# Patient Record
Sex: Female | Born: 1965 | Race: White | Hispanic: No | Marital: Married | Smoking: Never smoker
Health system: Southern US, Community
[De-identification: ages and names within clinical notes are randomized; demographics above are authoritative.]

## PROBLEM LIST (undated history)

## (undated) DIAGNOSIS — I10 Essential (primary) hypertension: Secondary | ICD-10-CM

## (undated) DIAGNOSIS — F419 Anxiety disorder, unspecified: Secondary | ICD-10-CM

## (undated) DIAGNOSIS — R011 Cardiac murmur, unspecified: Secondary | ICD-10-CM

## (undated) DIAGNOSIS — K219 Gastro-esophageal reflux disease without esophagitis: Secondary | ICD-10-CM

## (undated) DIAGNOSIS — I499 Cardiac arrhythmia, unspecified: Secondary | ICD-10-CM

## (undated) DIAGNOSIS — M1990 Unspecified osteoarthritis, unspecified site: Secondary | ICD-10-CM

## (undated) DIAGNOSIS — T7840XA Allergy, unspecified, initial encounter: Secondary | ICD-10-CM

## (undated) DIAGNOSIS — D649 Anemia, unspecified: Secondary | ICD-10-CM

## (undated) DIAGNOSIS — M810 Age-related osteoporosis without current pathological fracture: Secondary | ICD-10-CM

## (undated) DIAGNOSIS — G5600 Carpal tunnel syndrome, unspecified upper limb: Secondary | ICD-10-CM

## (undated) HISTORY — PX: ABDOMINAL HYSTERECTOMY: SHX81

## (undated) HISTORY — PX: TUBAL LIGATION: SHX77

## (undated) HISTORY — DX: Carpal tunnel syndrome, unspecified upper limb: G56.00

## (undated) HISTORY — DX: Cardiac murmur, unspecified: R01.1

## (undated) HISTORY — DX: Essential (primary) hypertension: I10

## (undated) HISTORY — DX: Age-related osteoporosis without current pathological fracture: M81.0

## (undated) HISTORY — DX: Unspecified osteoarthritis, unspecified site: M19.90

## (undated) HISTORY — PX: CHOLECYSTECTOMY: SHX55

## (undated) HISTORY — DX: Anemia, unspecified: D64.9

## (undated) HISTORY — DX: Gastro-esophageal reflux disease without esophagitis: K21.9

## (undated) HISTORY — DX: Allergy, unspecified, initial encounter: T78.40XA

## (undated) HISTORY — DX: Anxiety disorder, unspecified: F41.9

## (undated) HISTORY — DX: Cardiac arrhythmia, unspecified: I49.9

---

## 2018-06-09 DIAGNOSIS — G5600 Carpal tunnel syndrome, unspecified upper limb: Secondary | ICD-10-CM | POA: Insufficient documentation

## 2018-06-16 ENCOUNTER — Ambulatory Visit (INDEPENDENT_AMBULATORY_CARE_PROVIDER_SITE_OTHER): Payer: BLUE CROSS/BLUE SHIELD | Admitting: Vascular Surgery

## 2018-06-16 ENCOUNTER — Encounter (INDEPENDENT_AMBULATORY_CARE_PROVIDER_SITE_OTHER): Payer: Self-pay | Admitting: Vascular Surgery

## 2018-06-16 VITALS — BP 138/85 | HR 80 | Resp 16 | Ht 67.0 in | Wt 217.0 lb

## 2018-06-16 DIAGNOSIS — M7989 Other specified soft tissue disorders: Secondary | ICD-10-CM

## 2018-06-16 DIAGNOSIS — I83813 Varicose veins of bilateral lower extremities with pain: Secondary | ICD-10-CM

## 2018-06-16 DIAGNOSIS — I872 Venous insufficiency (chronic) (peripheral): Secondary | ICD-10-CM

## 2018-06-16 NOTE — Progress Notes (Signed)
MRN : [id number]  Mary Olsen is a 52 y.o. (May 04, 1966) female who presents with chief complaint of painful varicose veins.  History of Present Illness:  The patient is seen for evaluation of symptomatic varicose veins. The patient relates burning and stinging which worsened steadily throughout the course of the day, particularly with standing. The patient also notes an aching and throbbing pain over the varicosities, particularly with prolonged dependent positions. The symptoms are significantly improved with elevation.  The patient also notes that during hot weather the symptoms are greatly intensified. The patient states the pain from the varicose veins interferes with work, daily exercise, shopping and household maintenance. At this point, the symptoms are persistent and severe enough that they're having a negative impact on lifestyle and are interfering with daily activities.  There is no history of DVT, PE or superficial thrombophlebitis. There is no history of ulceration or hemorrhage. The patient denies a significant family history of varicose veins. OB history: P2  The patient has not worn graduated compression in the past. At the present time the patient has not been using over-the-counter analgesics. There is no history of prior surgical intervention or sclerotherapy.    No outpatient medications have been marked as taking for the 06/16/18 encounter (Appointment) with Gilda Crease, Latina Craver, MD.    No past medical history on file.    Social History Social History   Tobacco Use  . Smoking status: Not on file  Substance Use Topics  . Alcohol use: Not on file  . Drug use: Not on file    Family History No family history on file. No family history of bleeding/clotting disorders, porphyria or autoimmune disease   Allergies not on file   REVIEW OF SYSTEMS (Negative unless checked)  Constitutional: [] Weight loss  [] Fever  [] Chills Cardiac: [] Chest pain   [] Chest pressure    [] Palpitations   [] Shortness of breath when laying flat   [] Shortness of breath with exertion. Vascular:  [] Pain in legs with walking   [x] Pain in legs at rest  [] History of DVT   [] Phlebitis   [x] Swelling in legs   [x] Varicose veins   [] Non-healing ulcers Pulmonary:   [] Uses home oxygen   [] Productive cough   [] Hemoptysis   [] Wheeze  [] COPD   [] Asthma Neurologic:  [] Dizziness   [] Seizures   [] History of stroke   [] History of TIA  [] Aphasia   [] Vissual changes   [] Weakness or numbness in arm   [] Weakness or numbness in leg Musculoskeletal:   [] Joint swelling   [] Joint pain   [] Low back pain Hematologic:  [] Easy bruising  [] Easy bleeding   [] Hypercoagulable state   [] Anemic Gastrointestinal:  [] Diarrhea   [] Vomiting  [] Gastroesophageal reflux/heartburn   [] Difficulty swallowing. Genitourinary:  [] Chronic kidney disease   [] Difficult urination  [] Frequent urination   [] Blood in urine Skin:  [] Rashes   [] Ulcers  Psychological:  [] History of anxiety   []  History of major depression.  Physical Examination  There were no vitals filed for this visit. There is no height or weight on file to calculate BMI. Gen: WD/WN, NAD Head: Quinn/AT, No temporalis wasting.  Ear/Nose/Throat: Hearing grossly intact, nares w/o erythema or drainage, poor dentition Eyes: PER, EOMI, sclera nonicteric.  Neck: Supple, no masses.  No bruit or JVD.  Pulmonary:  Good air movement, clear to auscultation bilaterally, no use of accessory muscles.  Cardiac: RRR, normal S1, S2, no Murmurs. Vascular: Large varicosities present extensively greater than 6 mm bilaterally.  Mild venous stasis changes  to the legs bilaterally.  2+ soft pitting edema Vessel Right Left  Radial Palpable Palpable  PT Palpable Palpable  DP Palpable Palpable   Gastrointestinal: soft, non-distended. No guarding/no peritoneal signs.  Musculoskeletal: M/S 5/5 throughout.  No deformity or atrophy.  Neurologic: CN 2-12 intact. Pain and light touch intact in  extremities.  Symmetrical.  Speech is fluent. Motor exam as listed above. Psychiatric: Judgment intact, Mood & affect appropriate for pt's clinical situation. Dermatologic: No rashes or ulcers noted.  No changes consistent with cellulitis. Lymph : No Cervical lymphadenopathy, no lichenification or skin changes of chronic lymphedema.  CBC No results found for: WBC, HGB, HCT, MCV, PLT  BMET No results found for: NA, K, CL, CO2, GLUCOSE, BUN, CREATININE, CALCIUM, GFRNONAA, GFRAA CrCl cannot be calculated (No successful lab value found.).  COAG No results found for: INR, PROTIME  Radiology No results found.    Assessment/Plan 1. Varicose veins of both lower extremities with pain  Recommend:  The patient has large symptomatic varicose veins that are painful and associated with swelling.  I have had a long discussion with the patient regarding  varicose veins and why they cause symptoms.  Patient will begin wearing graduated compression stockings class 1 on a daily basis, beginning first thing in the morning and removing them in the evening. The patient is instructed specifically not to sleep in the stockings.    The patient  will also begin using over-the-counter analgesics such as Motrin 600 mg po TID to help control the symptoms.    In addition, behavioral modification including elevation during the day will be initiated.    Pending the results of these changes the  patient will be reevaluated in three months.   An  ultrasound of the venous system will be obtained.   Further plans will be based on the ultrasound results and whether conservative therapies are successful at eliminating the pain and swelling.   2. Chronic venous insufficiency No surgery or intervention at this point in time.    I have had a long discussion with the patient regarding venous insufficiency and why it  causes symptoms. I have discussed with the patient the chronic skin changes that accompany venous  insufficiency and the long term sequela such as infection and ulceration.  Patient will begin wearing graduated compression stockings class 1 (20-30 mmHg) or compression wraps on a daily basis a prescription was given. The patient will put the stockings on first thing in the morning and removing them in the evening. The patient is instructed specifically not to sleep in the stockings.    In addition, behavioral modification including several periods of elevation of the lower extremities during the day will be continued. I have demonstrated that proper elevation is a position with the ankles at heart level.  The patient is instructed to begin routine exercise, especially walking on a daily basis  Patient should undergo duplex ultrasound of the venous system to ensure that DVT or reflux is not present.  Following the review of the ultrasound the patient will follow up in 2-3 months to reassess the degree of swelling and the control that graduated compression stockings or compression wraps  is offering.   The patient can be assessed for a Lymph Pump at that time  3. Leg swelling See #1&2    Levora Dredge, MD  06/16/2018 12:50 PM

## 2018-06-19 ENCOUNTER — Encounter (INDEPENDENT_AMBULATORY_CARE_PROVIDER_SITE_OTHER): Payer: Self-pay | Admitting: Vascular Surgery

## 2018-07-25 ENCOUNTER — Ambulatory Visit (INDEPENDENT_AMBULATORY_CARE_PROVIDER_SITE_OTHER): Payer: BLUE CROSS/BLUE SHIELD | Admitting: Vascular Surgery

## 2018-07-25 ENCOUNTER — Encounter (INDEPENDENT_AMBULATORY_CARE_PROVIDER_SITE_OTHER): Payer: BLUE CROSS/BLUE SHIELD

## 2019-05-19 DIAGNOSIS — I10 Essential (primary) hypertension: Secondary | ICD-10-CM | POA: Insufficient documentation

## 2019-05-19 DIAGNOSIS — K219 Gastro-esophageal reflux disease without esophagitis: Secondary | ICD-10-CM | POA: Insufficient documentation

## 2019-05-19 DIAGNOSIS — E66811 Obesity, class 1: Secondary | ICD-10-CM | POA: Insufficient documentation

## 2019-05-19 DIAGNOSIS — F419 Anxiety disorder, unspecified: Secondary | ICD-10-CM | POA: Insufficient documentation

## 2019-05-19 DIAGNOSIS — E66812 Obesity, class 2: Secondary | ICD-10-CM | POA: Insufficient documentation

## 2019-05-19 DIAGNOSIS — N959 Unspecified menopausal and perimenopausal disorder: Secondary | ICD-10-CM | POA: Insufficient documentation

## 2019-05-20 LAB — HM HEPATITIS C SCREENING LAB: HM Hepatitis Screen: NEGATIVE

## 2021-03-14 ENCOUNTER — Other Ambulatory Visit: Payer: Self-pay | Admitting: Infectious Diseases

## 2021-03-14 DIAGNOSIS — Z1231 Encounter for screening mammogram for malignant neoplasm of breast: Secondary | ICD-10-CM

## 2021-04-16 ENCOUNTER — Other Ambulatory Visit: Payer: Self-pay

## 2021-04-16 ENCOUNTER — Ambulatory Visit
Admission: RE | Admit: 2021-04-16 | Discharge: 2021-04-16 | Disposition: A | Discharge status: Home / Self Care | Payer: No Typology Code available for payment source | Source: Ambulatory Visit | Attending: Infectious Diseases | Admitting: Infectious Diseases

## 2021-04-16 DIAGNOSIS — Z1231 Encounter for screening mammogram for malignant neoplasm of breast: Secondary | ICD-10-CM | POA: Diagnosis not present

## 2021-04-17 ENCOUNTER — Inpatient Hospital Stay
Admission: RE | Admit: 2021-04-17 | Discharge: 2021-04-17 | Disposition: A | Discharge status: Home / Self Care | Payer: Self-pay | Source: Ambulatory Visit | Attending: *Deleted | Admitting: *Deleted

## 2021-04-17 ENCOUNTER — Other Ambulatory Visit: Payer: Self-pay | Admitting: *Deleted

## 2021-04-17 DIAGNOSIS — Z1231 Encounter for screening mammogram for malignant neoplasm of breast: Secondary | ICD-10-CM

## 2022-07-14 ENCOUNTER — Other Ambulatory Visit: Payer: Self-pay | Admitting: Infectious Diseases

## 2022-07-14 DIAGNOSIS — Z1231 Encounter for screening mammogram for malignant neoplasm of breast: Secondary | ICD-10-CM

## 2022-09-14 IMAGING — MG MM DIGITAL SCREENING BILAT W/ TOMO AND CAD
8 series · 8 of 24 positions shown · non-contrast
Comparison: Previous exam(s).

CLINICAL DATA: Screening.

EXAM:
DIGITAL SCREENING BILATERAL MAMMOGRAM WITH TOMOSYNTHESIS AND CAD
TECHNIQUE: Bilateral screening digital craniocaudal and mediolateral oblique
mammograms were obtained. Bilateral screening digital breast
tomosynthesis was performed. The images were evaluated with
computer-aided detection.

[L MLO synth-2D]
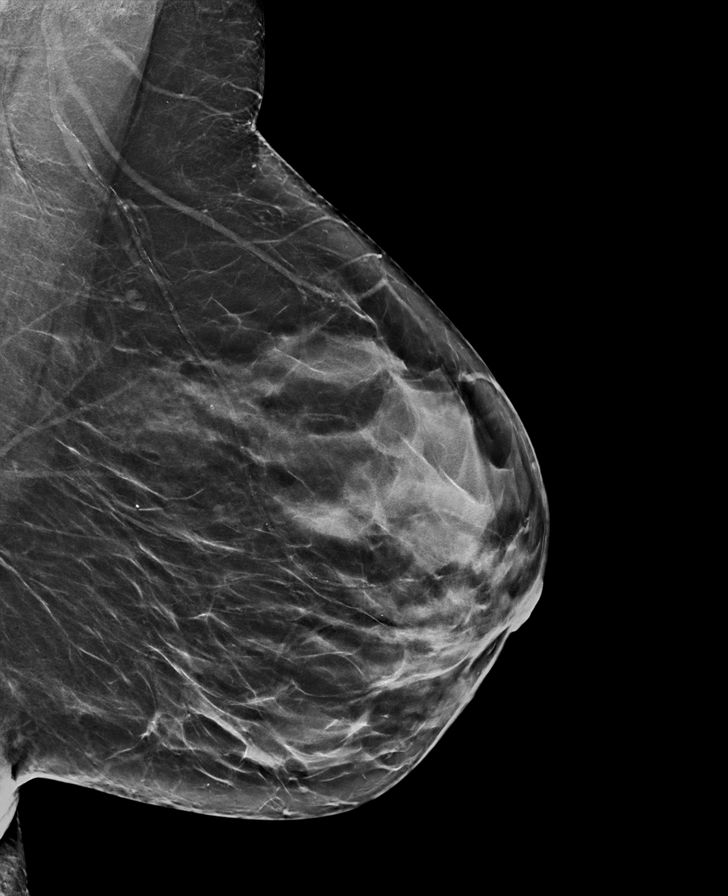

[R MLO synth-2D]
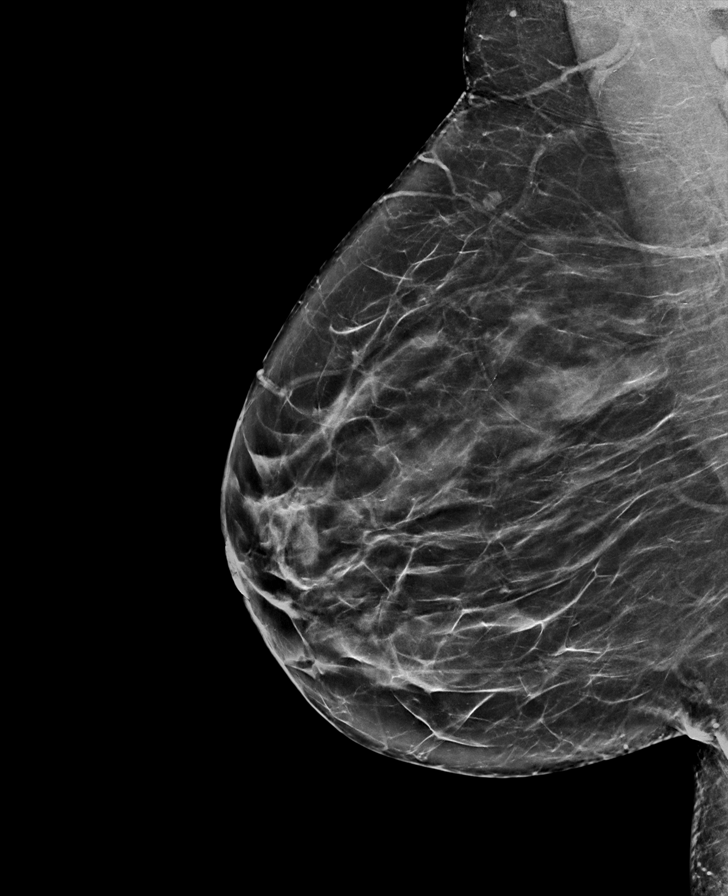

[L CC synth-2D]
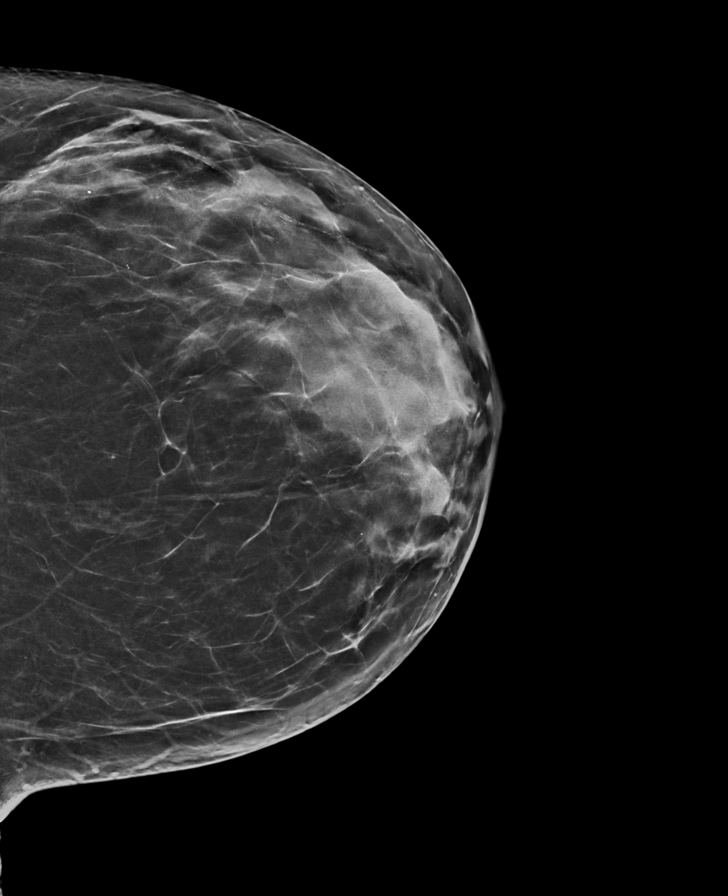

[R CC synth-2D]
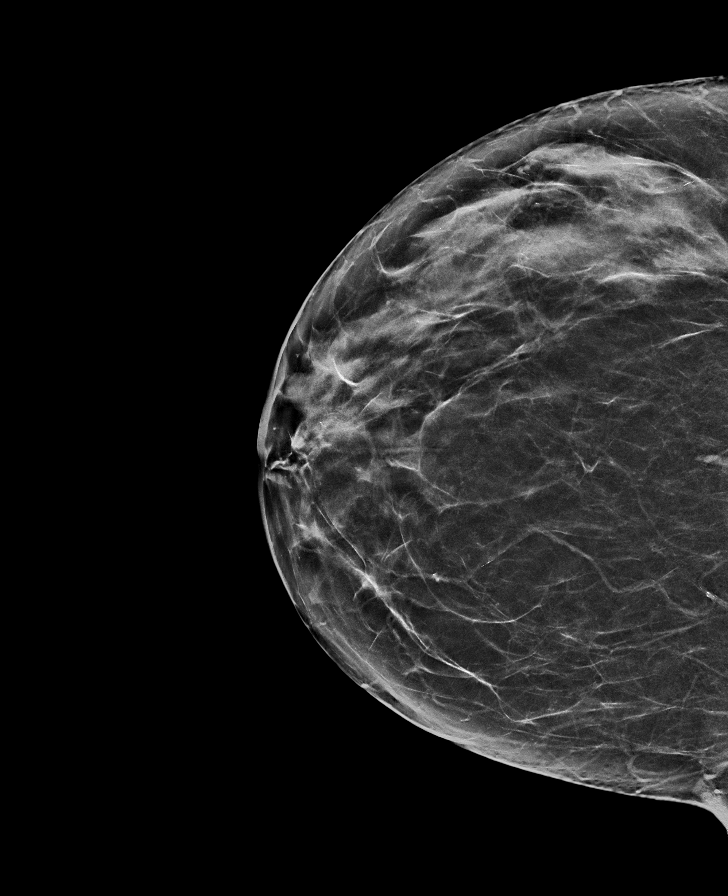

[L CC tomo · tomo slice 36/71.0]
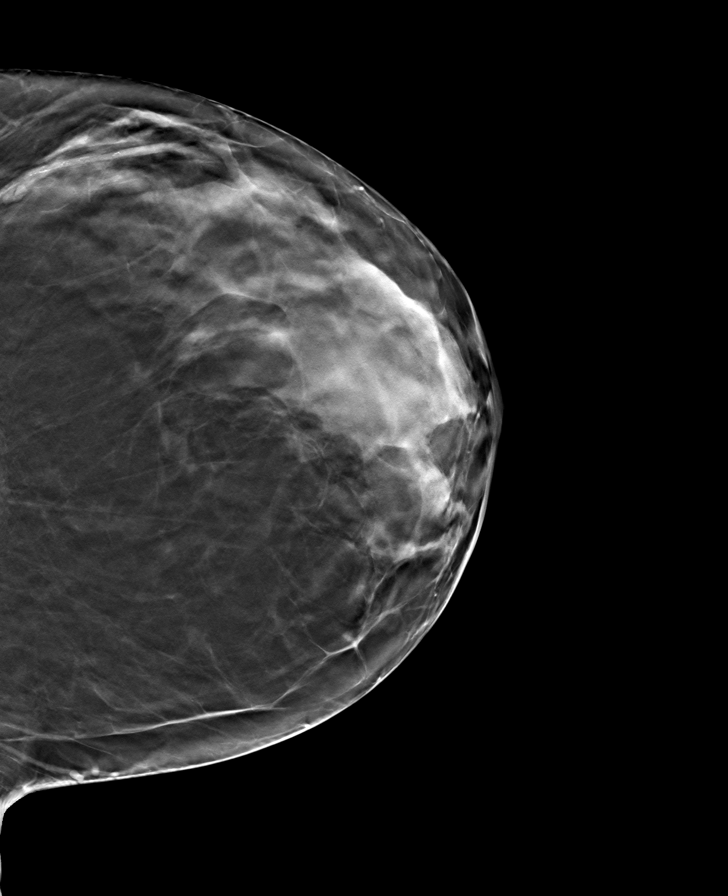

[R CC tomo · tomo slice 34/67.0]
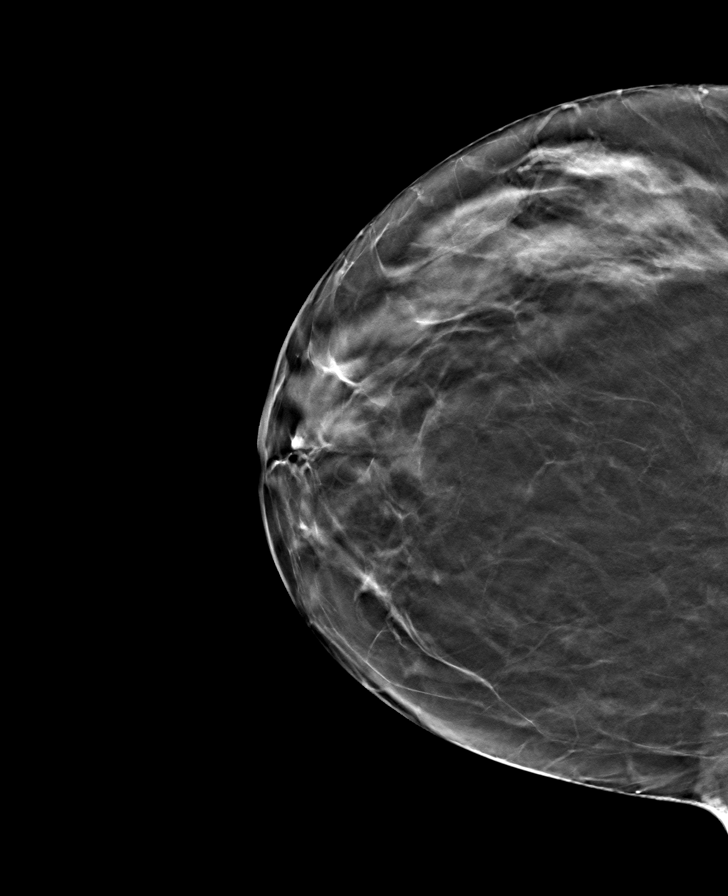

[L MLO tomo · tomo slice 39/78.0]
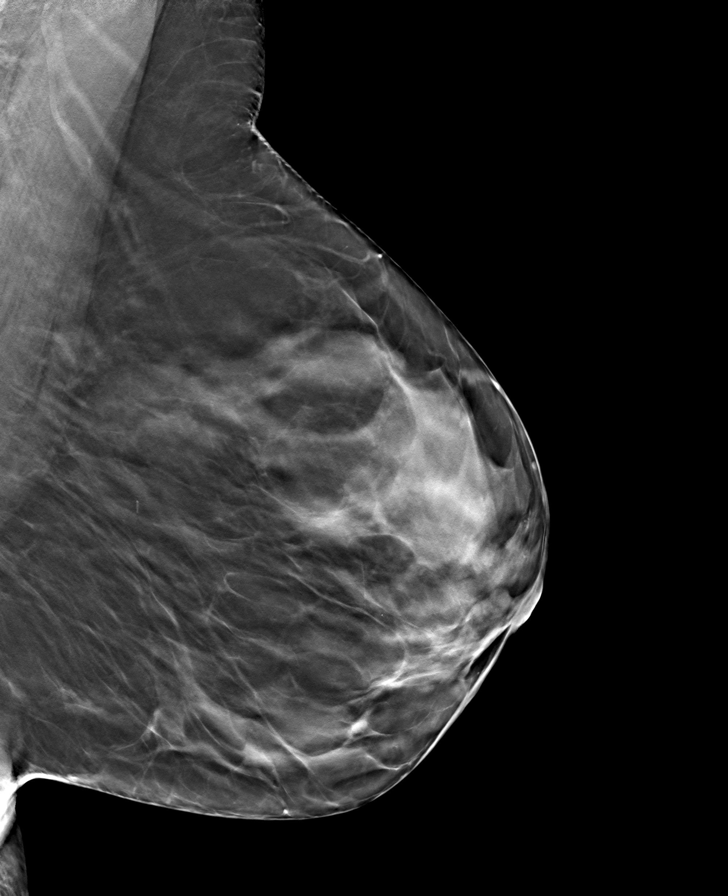

[R MLO tomo · tomo slice 38/75.0]
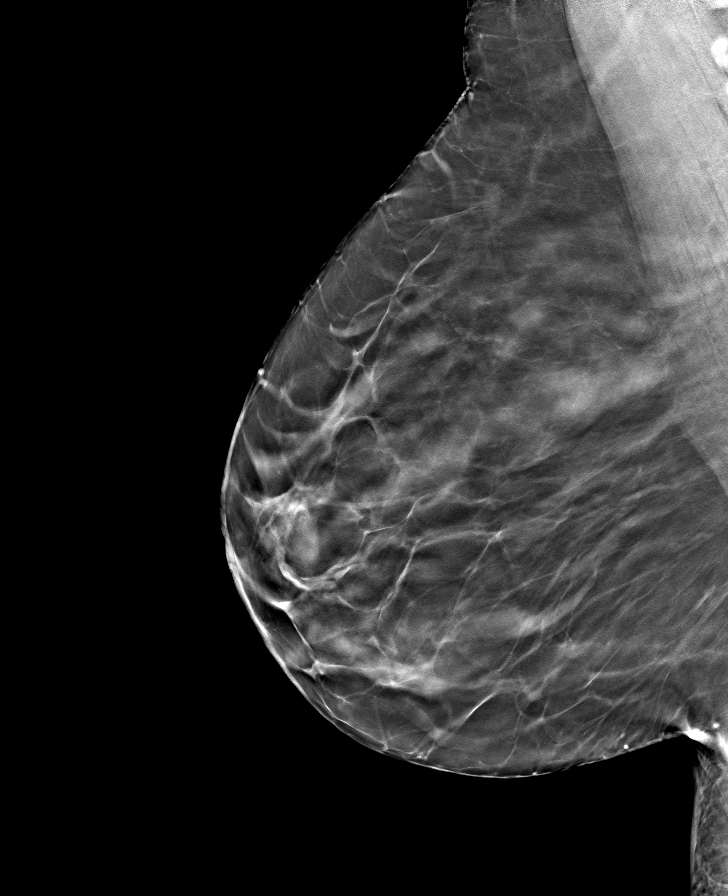

[8 of 24 positions shown; findings below may reference images not displayed]

ACR Breast Density Category c: The breast tissue is heterogeneously
dense, which may obscure small masses.
FINDINGS: There are no findings suspicious for malignancy.
IMPRESSION: No mammographic evidence of malignancy. A result letter of this
screening mammogram will be mailed directly to the patient.

RECOMMENDATION:
Screening mammogram in one year. (Code:Q3-W-BC3)

BI-RADS CATEGORY  1: Negative.

## 2023-09-19 ENCOUNTER — Ambulatory Visit
Admission: EM | Admit: 2023-09-19 | Discharge: 2023-09-19 | Disposition: A | Discharge status: Home / Self Care | Payer: No Typology Code available for payment source | Attending: Emergency Medicine | Admitting: Emergency Medicine

## 2023-09-19 DIAGNOSIS — R030 Elevated blood-pressure reading, without diagnosis of hypertension: Secondary | ICD-10-CM

## 2023-09-19 DIAGNOSIS — J069 Acute upper respiratory infection, unspecified: Secondary | ICD-10-CM

## 2023-09-19 LAB — SARS CORONAVIRUS 2 BY RT PCR: SARS Coronavirus 2 by RT PCR: NEGATIVE

## 2023-09-19 LAB — GROUP A STREP BY PCR: Group A Strep by PCR: NOT DETECTED

## 2023-09-19 MED ORDER — PROMETHAZINE-DM 6.25-15 MG/5ML PO SYRP: 5.0000 mL | ORAL_SOLUTION | Freq: Four times a day (QID) | ORAL | 0 refills | Status: DC | PRN

## 2023-09-19 NOTE — Discharge Instructions (Addendum)
Most likely you have a viral illness: no antibiotic as indicated at this time, May treat with OTC meds of choice. Make sure to drink plenty of fluids to stay hydrated(gatorade, water, popsicles,jello,etc), avoid caffeine products. Follow up with PCP. Return as needed. 

## 2023-09-19 NOTE — ED Provider Notes (Signed)
[facility]    CSN: [id number] Arrival date & time: 09/19/23  0946      History   Chief Complaint Chief Complaint  Patient presents with   Cough   Sore Throat    HPI Mary Olsen is a 57 y.o. female.   57 year old female, Mary Olsen, presents to the care for evaluation of cough, sore throat, body aches and diarrhea.  Patient states she has tried over-the-counter stuff, is no longer on blood pressure medication and is looking for a new PCP, will refer to new PCP and schedule appointment for patient.   The history is provided by the patient. No language interpreter was used.  Sore Throat    Past Medical History:  Diagnosis Date   Anemia    Anxiety    Carpal tunnel syndrome    Osteoporosis     Patient Active Problem List   Diagnosis Date Noted   Viral URI with cough 09/19/2023   Elevated blood pressure reading 09/19/2023    Past Surgical History:  Procedure Laterality Date   ABDOMINAL HYSTERECTOMY     CHOLECYSTECTOMY      OB History   No obstetric history on file.      Home Medications    Prior to Admission medications   Medication Sig Start Date End Date Taking? Authorizing Provider  acetaminophen (TYLENOL) 500 MG tablet Take 500 mg by mouth every 6 (six) hours as needed.   Yes [provider]  B Complex-Biotin-FA (B-COMPLEX PO) Take by mouth daily.   Yes [provider]  Bioflavonoid Products (ESTER C PO) Take 1,000 mg by mouth.   Yes [provider]  Calcium Carb-Cholecalciferol (CALCIUM 600 + D PO) Take by mouth.   Yes [provider]  esomeprazole (NEXIUM) 20 MG capsule Take 20 mg by mouth daily at 12 noon.   Yes [provider]  estradiol (ESTRACE) 1 MG tablet Take 1 mg by mouth daily. 05/27/18  Yes [provider]  Ferrous Sulfate (IRON) 90 (18 Fe) MG TABS Take by mouth daily.   Yes [provider]  L-Lysine 500 MG CAPS Take by mouth daily.   Yes [provider]   MAGNESIUM GLYCINATE PLUS PO Take 400 mg by mouth at bedtime.   Yes [provider]  meloxicam (MOBIC) 15 MG tablet TAKE 1 TABLET BY MOUTH EVERY DAY WITH MEAL. 06/09/18  Yes [provider]  Multiple Vitamin (MULTI-VITAMINS PO) Take by mouth.   Yes [provider]  potassium chloride (K-DUR) 10 MEQ tablet Take 10 mEq by mouth daily. 05/23/18  Yes [provider]  promethazine-dextromethorphan (PROMETHAZINE-DM) 6.25-15 MG/5ML syrup Take 5 mLs by mouth 4 (four) times daily as needed. 09/19/23  Yes Demaria Deeney, Para March, NP  Selenium 200 MCG CAPS Take by mouth daily.   Yes [provider]  traZODone (DESYREL) 100 MG tablet TAKE 1/2 TAB BY MOUTH AT BEDTIME FOR 1 TO 2 WEEKS THEN TAKE 1 TABLET BY MOUTH AT BEDTIME 05/17/18  Yes [provider]  triamcinolone (NASACORT ALLERGY 24HR) 55 MCG/ACT AERO nasal inhaler Place 2 sprays into the nose daily.   Yes [provider]  vitamin E (VITAMIN E) 400 UNIT capsule Take 400 Units by mouth daily.   Yes [provider]    Family History Family History  Problem Relation Age of Onset   COPD Mother    Heart disease Mother    Heart attack Mother    Stroke Mother    Emphysema  Mother    Breast cancer Neg Hx     Social History Social History   Tobacco Use   Smoking status: Never   Smokeless tobacco: Never  Vaping Use   Vaping status: Never Used  Substance Use Topics   Alcohol use: Yes    Comment: occasionally   Drug use: Never     Allergies   Lifitegrast, Lisinopril, and Losartan   Review of Systems Review of Systems  Constitutional:  Negative for fever.  HENT:  Positive for sore throat.   Respiratory:  Positive for cough.   Gastrointestinal:  Positive for diarrhea.  Musculoskeletal:  Positive for myalgias.  All other systems reviewed and are negative.    Physical Exam Triage Vital Signs ED Triage Vitals  Encounter Vitals Group     BP 09/19/23 1032 (!) 151/87      Systolic BP Percentile --      Diastolic BP Percentile --      Pulse Rate 09/19/23 1032 87     Resp --      Temp 09/19/23 1032 98.5 F (36.9 C)     Temp Source 09/19/23 1032 Oral     SpO2 09/19/23 1032 95 %     Weight 09/19/23 1030 218 lb (98.9 kg)     Height 09/19/23 1030 5\' 7"  (1.702 m)     Head Circumference --      Peak Flow --      Pain Score 09/19/23 1024 6     Pain Loc --      Pain Education --      Exclude from Growth Chart --    No data found.  Updated Vital Signs BP (!) 151/87 (BP Location: Left Arm)   Pulse 87   Temp 98.5 F (36.9 C) (Oral)   Ht 5\' 7"  (1.702 m)   Wt 218 lb (98.9 kg)   SpO2 95%   BMI 34.14 kg/m   Visual Acuity Right Eye Distance:   Left Eye Distance:   Bilateral Distance:    Right Eye Near:   Left Eye Near:    Bilateral Near:     Physical Exam Vitals and nursing note reviewed.  Constitutional:      General: She is not in acute distress.    Appearance: She is well-developed.  HENT:     Head: Normocephalic.     Right Ear: Tympanic membrane is retracted.     Left Ear: Tympanic membrane is retracted.     Nose: Congestion present.     Mouth/Throat:     Lips: Pink.     Mouth: Mucous membranes are moist.     Pharynx: Oropharynx is clear.  Eyes:     General: Lids are normal.     Conjunctiva/sclera: Conjunctivae normal.     Pupils: Pupils are equal, round, and reactive to light.  Neck:     Trachea: No tracheal deviation.  Cardiovascular:     Rate and Rhythm: Normal rate and regular rhythm.     Pulses: Normal pulses.     Heart sounds: Normal heart sounds. No murmur heard. Pulmonary:     Effort: Pulmonary effort is normal.     Breath sounds: Normal breath sounds and air entry.  Abdominal:     General: Bowel sounds are normal.     Palpations: Abdomen is soft.     Tenderness: There is no abdominal tenderness.  Musculoskeletal:        General: Normal range of motion.     Cervical  back: Normal range of motion.  Lymphadenopathy:      Cervical: No cervical adenopathy.  Skin:    General: Skin is warm and dry.     Findings: No rash.  Neurological:     General: No focal deficit present.     Mental Status: She is alert and oriented to person, place, and time.     GCS: GCS eye subscore is 4. GCS verbal subscore is 5. GCS motor subscore is 6.  Psychiatric:        Speech: Speech normal.        Behavior: Behavior normal. Behavior is cooperative.      UC Treatments / Results  Labs (all labs ordered are listed, but only abnormal results are displayed) Labs Reviewed  SARS CORONAVIRUS 2 BY RT PCR  GROUP A STREP BY PCR    EKG   Radiology No results found.  Procedures Procedures (including critical care time)  Medications Ordered in UC Medications - No data to display  Initial Impression / Assessment and Plan / UC Course  I have reviewed the triage vital signs and the nursing notes.  Pertinent labs & imaging results that were available during my care of the patient were reviewed by me and considered in my medical decision making (see chart for details).    Discussed exam findings and plan of care with patient, strict go to ER precautions given.   Patient verbalized understanding to this provider.  Ddx: Viral URI with cough, elevated blood pressure reading, allergies Final Clinical Impressions(s) / UC Diagnoses   Final diagnoses:  Viral URI with cough  Elevated blood pressure reading     Discharge Instructions      Most likely you have a viral illness: no antibiotic as indicated at this time, May treat with OTC meds of choice. Make sure to drink plenty of fluids to stay hydrated(gatorade, water, popsicles,jello,etc), avoid caffeine products. Follow up with PCP. Return as needed.     ED Prescriptions     Medication Sig Dispense Auth. Provider   promethazine-dextromethorphan (PROMETHAZINE-DM) 6.25-15 MG/5ML syrup Take 5 mLs by mouth 4 (four) times daily as needed. 118 mL Islam Eichinger, Para March, NP       PDMP not reviewed this encounter.   Clancy Gourd, NP 09/19/23 1422

## 2023-09-19 NOTE — ED Triage Notes (Signed)
Pt c/o cough, sore throat, body aches, elevated blood pressure.  Pt states that she does not have a doctor or insurance.  Pt was told that she only has high blood pressure due to anxiety and stopped her anxiety medication.   Pt took OTC mucinex and excedrin migraine

## 2023-09-19 NOTE — ED Triage Notes (Signed)
Called once from lobby to no answer

## 2023-10-03 ENCOUNTER — Telehealth: Payer: Self-pay

## 2023-10-03 DIAGNOSIS — N3000 Acute cystitis without hematuria: Secondary | ICD-10-CM

## 2023-10-03 MED ORDER — CEPHALEXIN 500 MG PO CAPS: 500.0000 mg | ORAL_CAPSULE | Freq: Two times a day (BID) | ORAL | 0 refills | Status: AC

## 2023-10-03 NOTE — Patient Instructions (Signed)
Urinary Tract Infection, Adult  A urinary tract infection (UTI) is an infection of any part of the urinary tract. The urinary tract includes the kidneys, ureters, bladder, and urethra. These organs make, store, and get rid of urine in the body. An upper UTI affects the ureters and kidneys. A lower UTI affects the bladder and urethra. What are the causes? Most urinary tract infections are caused by bacteria in your genital area around your urethra, where urine leaves your body. These bacteria grow and cause inflammation of your urinary tract. What increases the risk? You are more likely to develop this condition if: You have a urinary catheter that stays in place. You are not able to control when you urinate or have a bowel movement (incontinence). You are female and you: Use a spermicide or diaphragm for birth control. Have low estrogen levels. Are pregnant. You have certain genes that increase your risk. You are sexually active. You take antibiotic medicines. You have a condition that causes your flow of urine to slow down, such as: An enlarged prostate, if you are female. Blockage in your urethra. A kidney stone. A nerve condition that affects your bladder control (neurogenic bladder). Not getting enough to drink, or not urinating often. You have certain medical conditions, such as: Diabetes. A weak disease-fighting system (immunesystem). Sickle cell disease. Gout. Spinal cord injury. What are the signs or symptoms? Symptoms of this condition include: Needing to urinate right away (urgency). Frequent urination. This may include small amounts of urine each time you urinate. Pain or burning with urination. Blood in the urine. Urine that smells bad or unusual. Trouble urinating. Cloudy urine. Vaginal discharge, if you are female. Pain in the abdomen or the lower back. You may also have: Vomiting or a decreased appetite. Confusion. Irritability or tiredness. A fever or  chills. Diarrhea. The first symptom in older adults may be confusion. In some cases, they may not have any symptoms until the infection has worsened. How is this diagnosed? This condition is diagnosed based on your medical history and a physical exam. You may also have other tests, including: Urine tests. Blood tests. Tests for STIs (sexually transmitted infections). If you have had more than one UTI, a cystoscopy or imaging studies may be done to determine the cause of the infections. How is this treated? Treatment for this condition includes: Antibiotic medicine. Over-the-counter medicines to treat discomfort. Drinking enough water to stay hydrated. If you have frequent infections or have other conditions such as a kidney stone, you may need to see a health care provider who specializes in the urinary tract (urologist). In rare cases, urinary tract infections can cause sepsis. Sepsis is a life-threatening condition that occurs when the body responds to an infection. Sepsis is treated in the hospital with IV antibiotics, fluids, and other medicines. Follow these instructions at home:  Medicines Take over-the-counter and prescription medicines only as told by your health care provider. If you were prescribed an antibiotic medicine, take it as told by your health care provider. Do not stop using the antibiotic even if you start to feel better. General instructions Make sure you: Empty your bladder often and completely. Do not hold urine for long periods of time. Empty your bladder after sex. Wipe from front to back after urinating or having a bowel movement if you are female. Use each tissue only one time when you wipe. Drink enough fluid to keep your urine pale yellow. Keep all follow-up visits. This is important. Contact a health   care provider if: Your symptoms do not get better after 1-2 days. Your symptoms go away and then return. Get help right away if: You have severe pain in  your back or your lower abdomen. You have a fever or chills. You have nausea or vomiting. Summary A urinary tract infection (UTI) is an infection of any part of the urinary tract, which includes the kidneys, ureters, bladder, and urethra. Most urinary tract infections are caused by bacteria in your genital area. Treatment for this condition often includes antibiotic medicines. If you were prescribed an antibiotic medicine, take it as told by your health care provider. Do not stop using the antibiotic even if you start to feel better. Keep all follow-up visits. This is important. This information is not intended to replace advice given to you by your health care provider. Make sure you discuss any questions you have with your health care provider. Document Revised: 05/05/2020 Document Reviewed: 05/10/2020 [employer]vier Patient Education  2024 [employer]  

## 2023-10-03 NOTE — Progress Notes (Signed)
Virtual Visit Consent   Mary Olsen, you are scheduled for a virtual visit with a [facility] provider today. Just as with appointments in the office, your consent must be obtained to participate. Your consent will be active for this visit and any virtual visit you may have with one of our providers in the next 365 days. If you have a MyChart account, a copy of this consent can be sent to you electronically.  As this is a virtual visit, video technology does not allow for your provider to perform a traditional examination. This may limit your provider's ability to fully assess your condition. If your provider identifies any concerns that need to be evaluated in person or the need to arrange testing (such as labs, EKG, etc.), we will make arrangements to do so. Although advances in technology are sophisticated, we cannot ensure that it will always work on either your end or our end. If the connection with a video visit is poor, the visit may have to be switched to a telephone visit. With either a video or telephone visit, we are not always able to ensure that we have a secure connection.  By engaging in this virtual visit, you consent to the provision of healthcare and authorize for your insurance to be billed (if applicable) for the services provided during this visit. Depending on your insurance coverage, you may receive a charge related to this service.  I need to obtain your verbal consent now. Are you willing to proceed with your visit today? [name] has provided verbal consent on 10/03/2023 for a virtual visit (video or telephone). Georgana Curio, FNP  Date: 10/03/2023 12:59 PM  Virtual Visit via Video Note   I, Georgana Curio, connected with  Neshell Lollar  ([id number], [birth year]) on 10/03/23 at  1:00 PM EST by a video-enabled telemedicine application and verified that I am speaking with the correct person using two identifiers.  Location: Patient: Virtual Visit Location Patient:  Home Provider: Virtual Visit Location Provider: Home Office   I discussed the limitations of evaluation and management by telemedicine and the availability of in person appointments. The patient expressed understanding and agreed to proceed.    History of Present Illness: Mary Olsen is a 57 y.o. who identifies as a female who was assigned female at birth, and is being seen today for burning and frequency on urination. No fever or abd pain. Marland Kitchen  HPI: HPI  Problems:  Patient Active Problem List   Diagnosis Date Noted   Viral URI with cough 09/19/2023   Elevated blood pressure reading 09/19/2023    Allergies:  Allergies  Allergen Reactions   Lifitegrast     Blurred vision and redness   Lisinopril     Headache, dizziness and blurred vision   Losartan     cough   Medications:  Current Outpatient Medications:    acetaminophen (TYLENOL) 500 MG tablet, Take 500 mg by mouth every 6 (six) hours as needed., Disp: , Rfl:    B Complex-Biotin-FA (B-COMPLEX PO), Take by mouth daily., Disp: , Rfl:    Bioflavonoid Products (ESTER C PO), Take 1,000 mg by mouth., Disp: , Rfl:    Calcium Carb-Cholecalciferol (CALCIUM 600 + D PO), Take by mouth., Disp: , Rfl:    esomeprazole (NEXIUM) 20 MG capsule, Take 20 mg by mouth daily at 12 noon., Disp: , Rfl:    estradiol (ESTRACE) 1 MG tablet, Take 1 mg by mouth daily., Disp: , Rfl: 2   Ferrous Sulfate (IRON)  90 (18 Fe) MG TABS, Take by mouth daily., Disp: , Rfl:    L-Lysine 500 MG CAPS, Take by mouth daily., Disp: , Rfl:    MAGNESIUM GLYCINATE PLUS PO, Take 400 mg by mouth at bedtime., Disp: , Rfl:    meloxicam (MOBIC) 15 MG tablet, TAKE 1 TABLET BY MOUTH EVERY DAY WITH MEAL., Disp: , Rfl: 2   Multiple Vitamin (MULTI-VITAMINS PO), Take by mouth., Disp: , Rfl:    potassium chloride (K-DUR) 10 MEQ tablet, Take 10 mEq by mouth daily., Disp: , Rfl: 2   promethazine-dextromethorphan (PROMETHAZINE-DM) 6.25-15 MG/5ML syrup, Take 5 mLs by mouth 4 (four) times  daily as needed., Disp: 118 mL, Rfl: 0   Selenium 200 MCG CAPS, Take by mouth daily., Disp: , Rfl:    traZODone (DESYREL) 100 MG tablet, TAKE 1/2 TAB BY MOUTH AT BEDTIME FOR 1 TO 2 WEEKS THEN TAKE 1 TABLET BY MOUTH AT BEDTIME, Disp: , Rfl: 1   triamcinolone (NASACORT ALLERGY 24HR) 55 MCG/ACT AERO nasal inhaler, Place 2 sprays into the nose daily., Disp: , Rfl:    vitamin E (VITAMIN E) 400 UNIT capsule, Take 400 Units by mouth daily., Disp: , Rfl:   Observations/Objective: Patient is well-developed, well-nourished in no acute distress.  Resting comfortably  at home.  Head is normocephalic, atraumatic.  No labored breathing.  Speech is clear and coherent with logical content.  Patient is alert and oriented at baseline.    Assessment and Plan: 1. Acute cystitis without hematuria (Primary)  Increase fluids, urgent care if sx persist or worsen.   Follow Up Instructions: I discussed the assessment and treatment plan with the patient. The patient was provided an opportunity to ask questions and all were answered. The patient agreed with the plan and demonstrated an understanding of the instructions.  A copy of instructions were sent to the patient via MyChart unless otherwise noted below.     The patient was advised to call back or seek an in-person evaluation if the symptoms worsen or if the condition fails to improve as anticipated.    Georgana Curio, FNP

## 2023-10-22 ENCOUNTER — Encounter: Payer: Self-pay | Admitting: Physician Assistant

## 2023-10-22 ENCOUNTER — Ambulatory Visit (INDEPENDENT_AMBULATORY_CARE_PROVIDER_SITE_OTHER): Payer: Self-pay | Admitting: Physician Assistant

## 2023-10-22 VITALS — BP 156/98 | HR 89 | Temp 97.9°F | Ht 67.0 in | Wt 226.0 lb

## 2023-10-22 DIAGNOSIS — Z789 Other specified health status: Secondary | ICD-10-CM

## 2023-10-22 DIAGNOSIS — R011 Cardiac murmur, unspecified: Secondary | ICD-10-CM

## 2023-10-22 DIAGNOSIS — I10 Essential (primary) hypertension: Secondary | ICD-10-CM

## 2023-10-22 MED ORDER — LOSARTAN POTASSIUM 25 MG PO TABS: 25.0000 mg | ORAL_TABLET | Freq: Every day | ORAL | 0 refills | Status: DC

## 2023-10-22 NOTE — Assessment & Plan Note (Signed)
 2/6 systolic murmur on exam, present since childhood, patient thinks it was MVR.  Radiates into bilateral carotids.  Given the concurrent complaint of dyspnea on exertion, will order echo for further evaluation.

## 2023-10-22 NOTE — Progress Notes (Signed)
 Date:  10/22/2023   Name:  Mary Olsen   DOB:  05/18/1966   MRN:  [id number]   Chief Complaint: Establish Care and Hypertension (At home reading 154/96)  HPI Mary Olsen is a very pleasant 58 year old female with history of HTN, GERD, obesity, anxiety, congenital murmur who presents to the clinic today to establish care.  I also see her husband Mary Olsen.  She reports she has been struggling with high blood pressure readings consistently for years now, prior PCP thought it was anxiety related and tried many medications to control the anxiety and therefore the blood pressure.  Patient has never actually been on any antihypertensives to her knowledge.  She got frustrated with this provider and stopped all of her anxiety medications, and reports she actually feels much better with regard to her mood symptoms since stopping her medication.  Previously used: buspirone, trazodone, zoloft.  Failed: Wellbutrin, Lexapro, prozac  She tells me that she has had a heart murmur since childhood, thinks it might have been mitral valve regurgitation, no surgical interventions to her knowledge.  Unfortunately she does report dyspnea on exertion for example when climbing stairs, endorses a history of heart disease in the family.  Says she had an echo but it was many years ago.    Recent labs reviewed from 07/23/2023, all normal except for mild hyperlipidemia with LDL 134.   Medication list has been reviewed and updated.  Current Meds  Medication Sig   acetaminophen (TYLENOL) 500 MG tablet Take 500 mg by mouth every 6 (six) hours as needed.   ALPHA LIPOIC ACID PO Take by mouth.   B Complex-Biotin-FA (B-COMPLEX PO) Take by mouth daily.   Bioflavonoid Products (ESTER C PO) Take 1,000 mg by mouth.   BLACK COHOSH PO Take by mouth.   Calcium Carb-Cholecalciferol (CALCIUM 600 + D PO) Take by mouth.   Collagen-Vitamin C-Biotin (COLLAGEN PO) Take by mouth.   esomeprazole (NEXIUM) 20 MG capsule Take 20 mg by mouth  daily at 12 noon.   estradiol (ESTRACE) 1 MG tablet Take 1 mg by mouth daily.   Ferrous Sulfate (IRON) 90 (18 Fe) MG TABS Take by mouth daily.   L-Lysine 500 MG CAPS Take by mouth daily.   losartan  (COZAAR ) 25 MG tablet Take 1 tablet (25 mg total) by mouth daily.   MAGNESIUM GLYCINATE PLUS PO Take 400 mg by mouth at bedtime.   Misc Natural Products (BEET ROOT PO) Take by mouth.   Multiple Vitamin (MULTI-VITAMINS PO) Take by mouth.   Multiple Vitamins-Minerals (ZINC PO) Take by mouth.   Selenium 200 MCG CAPS Take by mouth daily.   UNABLE TO FIND Med Name: Oxbile   vitamin E (VITAMIN E) 400 UNIT capsule Take 400 Units by mouth daily.   [DISCONTINUED] potassium chloride (K-DUR) 10 MEQ tablet Take 10 mEq by mouth daily.     Review of Systems  Patient Active Problem List   Diagnosis Date Noted   Numerous dietary supplements 10/22/2023   Systolic murmur at cardiac apex 10/22/2023   Anxiety disorder 05/19/2019   Class 1 obesity due to excess calories with serious comorbidity and body mass index (BMI) of 34.0 to 34.9 in adult 05/19/2019   Essential hypertension 05/19/2019   Gastroesophageal reflux disease 05/19/2019   Menopausal and perimenopausal disorder 05/19/2019   Carpal tunnel syndrome 06/09/2018    Allergies  Allergen Reactions   Lifitegrast     Blurred vision and redness   Lisinopril     Headache, dizziness and  blurred vision   Losartan      cough    Immunization History  Administered Date(s) Administered   Influenza Inj Mdck Quad Pf 07/13/2022   Influenza, Quadrivalent, Recombinant, Inj, Pf 08/19/2019   Influenza-Unspecified 09/08/2019, 07/01/2023   Moderna Sars-Covid-2 Vaccination 10/20/2019   Pneumococcal Polysaccharide-23 08/28/2019   Tdap 05/19/2019   Zoster Recombinant(Shingrix) 05/19/2019, 09/01/2019    Past Surgical History:  Procedure Laterality Date   ABDOMINAL HYSTERECTOMY     CHOLECYSTECTOMY     TUBAL LIGATION  1987    Social History   Tobacco  Use   Smoking status: Never   Smokeless tobacco: Never  Vaping Use   Vaping status: Never Used  Substance Use Topics   Alcohol use: Yes    Comment: occasionally   Drug use: Never    Family History  Problem Relation Age of Onset   COPD Mother    Heart disease Mother    Heart attack Mother    Stroke Mother    Emphysema Mother    Alcohol abuse Mother    Anxiety disorder Mother    Hypertension Mother    Varicose Veins Mother    Alcohol abuse Father    Arthritis Father    Heart disease Father    Hypertension Father    Arthritis Paternal Grandmother    Hypertension Paternal Grandmother    Alcohol abuse Sister    Anxiety disorder Sister    Heart disease Sister    Hypertension Sister    Varicose Veins Sister    Alcohol abuse Paternal Aunt    Hypertension Paternal Aunt    Breast cancer Neg Hx         10/22/2023   10:49 AM  GAD 7 : Generalized Anxiety Score  Nervous, Anxious, on Edge 3  Control/stop worrying 0  Worry too much - different things 0  Trouble relaxing 3  Restless 3  Easily annoyed or irritable 1  Afraid - awful might happen 0  Total GAD 7 Score 10  Anxiety Difficulty Somewhat difficult       10/22/2023   10:48 AM 10/22/2023   10:47 AM  Depression screen PHQ 2/9  Decreased Interest 3 3  Down, Depressed, Hopeless 0 0  PHQ - 2 Score 3 3  Altered sleeping 0 0  Tired, decreased energy 3 3  Change in appetite 3 3  Feeling bad or failure about yourself  0 0  Trouble concentrating 3 3  Moving slowly or fidgety/restless 0 0  Suicidal thoughts 0 0  PHQ-9 Score 12 12  Difficult doing work/chores Somewhat difficult Not difficult at all    BP Readings from Last 3 Encounters:  10/22/23 (!) 156/98  09/19/23 (!) 151/87  06/16/18 138/85    Wt Readings from Last 3 Encounters:  10/22/23 226 lb (102.5 kg)  09/19/23 218 lb (98.9 kg)  06/16/18 217 lb (98.4 kg)    BP (!) 156/98   Pulse 89   Temp 97.9 F (36.6 C)   Ht 5' 7 (1.702 m)   Wt 226 lb (102.5  kg)   SpO2 97%   BMI 35.40 kg/m   Physical Exam Vitals and nursing note reviewed.  Constitutional:      Appearance: Normal appearance.  Neck:     Vascular: No carotid bruit.  Cardiovascular:     Rate and Rhythm: Normal rate and regular rhythm.     Heart sounds: Murmur heard.     Systolic murmur is present with a grade of 2/6.  No friction rub. No gallop.     Comments: Systolic murmur 2/6 best appreciated at the apex with radiation to bilateral carotids Pulmonary:     Effort: Pulmonary effort is normal.     Breath sounds: Normal breath sounds.  Abdominal:     General: There is no distension.  Musculoskeletal:        General: Normal range of motion.  Skin:    General: Skin is warm and dry.  Neurological:     Mental Status: She is alert and oriented to person, place, and time.     Gait: Gait is intact.  Psychiatric:        Mood and Affect: Mood and affect normal.     Recent Labs  No results found for: NA, K, CL, CO2, GLUCOSE, BUN, CREATININE, CALCIUM, PROT, ALBUMIN, AST, ALT, ALKPHOS, BILITOT, GFRNONAA, GFRAA  No results found for: WBC, HGB, HCT, MCV, PLT No results found for: HGBA1C No results found for: CHOL, HDL, LDLCALC, LDLDIRECT, TRIG, CHOLHDL No results found for: TSH   Assessment and Plan:  Essential hypertension Assessment & Plan: Will begin with low-dose losartan .  Discussed potential for this medication to increase potassium, so she will stop her potassium supplement. Patient to continue home BP monitoring and report back in about 2 weeks via MyChart message.    Considered using a thiazide diuretic, but with patient's reported history of hypokalemia, we opted for ARB instead.  Also considered beta-blocker such as propranolol given her history of anxiety, but she feels that her anxiety is presently well-controlled without medication.  Reviewed the Seven S's of hypertension including salt, smoking,  stimulants (e.g. caffeine), stress, sleep, snoring (OSA), sedentary lifestyle.  Recommend routine physical activity which can start small (10 min daily walk) with eventual goal of 150 min of heart rate elevation per week. Examples could include brisk walk, stationary bike, etc. At least twice per week, I recommend some type of resistance training which could be weights, resistance bands, or body weight exercises - this would count toward the weekly goal.   Orders: -     Losartan  Potassium; Take 1 tablet (25 mg total) by mouth daily.  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  Radiates into bilateral carotids.  Given the concurrent complaint of dyspnea on exertion, will order echo for further evaluation.  Orders: -     ECHOCARDIOGRAM COMPLETE  Numerous dietary supplements Assessment & Plan: Briefly discussed the patient's many dietary supplements today.  Encouraged to reduce some of these, as they are likely unnecessary, costly, and add to her overall pill burden. She will consider it.       Return in about 6 weeks (around 12/03/2023) for OV f/u HTN.    Rolan Hoyle, PA-C, DMSc, Nutritionist [facility] [phone]

## 2023-10-22 NOTE — Assessment & Plan Note (Addendum)
 Will begin with low-dose losartan .  Discussed potential for this medication to increase potassium, so she will stop her potassium supplement. Patient to continue home BP monitoring and report back in about 2 weeks via MyChart message.    Considered using a thiazide diuretic, but with patient's reported history of hypokalemia, we opted for ARB instead.  Also considered beta-blocker such as propranolol given her history of anxiety, but she feels that her anxiety is presently well-controlled without medication.  Reviewed the Seven S's of hypertension including salt, smoking, stimulants (e.g. caffeine), stress, sleep, snoring (OSA), sedentary lifestyle.  Recommend routine physical activity which can start small (10 min daily walk) with eventual goal of 150 min of heart rate elevation per week. Examples could include brisk walk, stationary bike, etc. At least twice per week, I recommend some type of resistance training which could be weights, resistance bands, or body weight exercises - this would count toward the weekly goal.

## 2023-10-22 NOTE — Assessment & Plan Note (Signed)
 Briefly discussed the patient's many dietary supplements today.  Encouraged to reduce some of these, as they are likely unnecessary, costly, and add to her overall pill burden. She will consider it.

## 2023-10-22 NOTE — Patient Instructions (Addendum)
-  It was a pleasure to see you today! Please review your visit summary for helpful information -I would encourage you to follow your care via MyChart where you can access lab results, notes, messages, and more -If you feel that we did a nice job today, please complete your after-visit survey and leave [place] a Google review! Your CMA today was Mariann Barter and your provider was Alvester Morin, PA-C, DMSc -Please return for follow-up in about 6 weeks  Reviewed the "Seven S's" of hypertension including salt, smoking, stimulants (e.g. caffeine), stress, sleep, snoring (OSA), sedentary lifestyle.

## 2023-11-08 ENCOUNTER — Other Ambulatory Visit: Payer: Self-pay | Admitting: Physician Assistant

## 2023-11-08 DIAGNOSIS — R011 Cardiac murmur, unspecified: Secondary | ICD-10-CM

## 2023-11-11 ENCOUNTER — Other Ambulatory Visit: Payer: Self-pay | Admitting: Physician Assistant

## 2023-11-11 DIAGNOSIS — I10 Essential (primary) hypertension: Secondary | ICD-10-CM

## 2023-11-12 ENCOUNTER — Other Ambulatory Visit: Payer: Self-pay

## 2023-11-12 ENCOUNTER — Other Ambulatory Visit: Payer: Self-pay | Admitting: Physician Assistant

## 2023-11-12 DIAGNOSIS — I10 Essential (primary) hypertension: Secondary | ICD-10-CM

## 2023-11-12 MED ORDER — LOSARTAN POTASSIUM 50 MG PO TABS
50.0000 mg | ORAL_TABLET | Freq: Every day | ORAL | 0 refills | Status: DC
Start: 2023-11-12 — End: 2023-12-03

## 2023-11-12 NOTE — Telephone Encounter (Signed)
Requested Prescriptions  Refused Prescriptions Disp Refills   losartan (COZAAR) 25 MG tablet [Pharmacy Med Name: Losartan Potassium 25 MG Oral Tablet] 30 tablet 0    Sig: Take 1 tablet by mouth once daily     Cardiovascular:  Angiotensin Receptor Blockers Failed - 11/12/2023 10:37 AM      Failed - Cr in normal range and within 180 days    No results found for: "CREATININE", "LABCREAU", "LABCREA", "POCCRE"       Failed - K in normal range and within 180 days    No results found for: "K", "POTASSIUM", "POCK"       Failed - Last BP in normal range    BP Readings from Last 1 Encounters:  10/22/23 (!) 156/98         Passed - Patient is not pregnant      Passed - Valid encounter within last 6 months    Recent Outpatient Visits           3 weeks ago Essential hypertension   [facility] at [facility], [place], [place]       Future Appointments             In 3 weeks Mordecai Maes, [place], PA [facility] at [facility], [facility]

## 2023-11-12 NOTE — Telephone Encounter (Signed)
Spoke with patient via phone.  No allergy to losartan.  Doing well since starting it last visit, although blood pressure is not quite at goal (see messages from this week).  Refilling at the 50 mg dose, patient in agreement with plan.  Follow-up as scheduled 12/03/2023

## 2023-11-23 ENCOUNTER — Ambulatory Visit
Admission: RE | Admit: 2023-11-23 | Discharge: 2023-11-23 | Disposition: A | Discharge status: Home / Self Care | Payer: PRIVATE HEALTH INSURANCE | Source: Ambulatory Visit | Attending: Physician Assistant | Admitting: Physician Assistant

## 2023-11-23 DIAGNOSIS — R0600 Dyspnea, unspecified: Secondary | ICD-10-CM | POA: Diagnosis present

## 2023-11-23 DIAGNOSIS — I351 Nonrheumatic aortic (valve) insufficiency: Secondary | ICD-10-CM | POA: Insufficient documentation

## 2023-11-23 DIAGNOSIS — R011 Cardiac murmur, unspecified: Secondary | ICD-10-CM | POA: Diagnosis not present

## 2023-11-23 DIAGNOSIS — I10 Essential (primary) hypertension: Secondary | ICD-10-CM | POA: Insufficient documentation

## 2023-11-23 LAB — ECHOCARDIOGRAM COMPLETE
AR max vel: 2.56 cm2
AV Area VTI: 2.77 cm2
AV Area mean vel: 2.63 cm2
AV Mean grad: 6 mm[Hg]
AV Peak grad: 12.7 mm[Hg]
Ao pk vel: 1.78 m/s
Area-P 1/2: 4.17 cm2
Calc EF: 66.4 %
MV VTI: 3.13 cm2
P 1/2 time: 547 ms
S' Lateral: 3.1 cm
Single Plane A2C EF: 64.3 %
Single Plane A4C EF: 68.7 %

## 2023-11-23 NOTE — Progress Notes (Signed)
*  PRELIMINARY RESULTS* Echocardiogram 2D Echocardiogram has been performed.  Carolyne Fiscal 11/23/2023, 9:46 AM

## 2023-12-03 ENCOUNTER — Ambulatory Visit: Payer: PRIVATE HEALTH INSURANCE | Attending: Physician Assistant

## 2023-12-03 ENCOUNTER — Encounter: Payer: Self-pay | Admitting: Physician Assistant

## 2023-12-03 ENCOUNTER — Ambulatory Visit: Payer: Self-pay | Admitting: Physician Assistant

## 2023-12-03 VITALS — BP 132/80 | HR 91 | Temp 97.6°F | Ht 67.0 in | Wt 227.0 lb

## 2023-12-03 DIAGNOSIS — I10 Essential (primary) hypertension: Secondary | ICD-10-CM | POA: Diagnosis not present

## 2023-12-03 DIAGNOSIS — R002 Palpitations: Secondary | ICD-10-CM

## 2023-12-03 DIAGNOSIS — R011 Cardiac murmur, unspecified: Secondary | ICD-10-CM

## 2023-12-03 MED ORDER — LOSARTAN POTASSIUM 100 MG PO TABS
100.0000 mg | ORAL_TABLET | Freq: Every day | ORAL | 1 refills | Status: DC
Start: 2023-12-03 — End: 2024-05-25

## 2023-12-03 NOTE — Progress Notes (Signed)
Date:  12/03/2023   Name:  Mary Olsen   DOB:  1966-02-23   MRN:  [id number]   Chief Complaint: Hypertension  HPI Mary Olsen presents for 6-week follow-up on HTN after starting low-dose losartan last visit. In the interim we increased from 25 mg to 50 mg with good compliance and tolerance. Home BP usually ~140/90.   She also had an echo to evaluate systolic murmur in the context of dyspnea on exertion; the echo was essentially normal for age revealing only mild aortic regurgitation, mild LVH, mild atrial enlargement but preserved systolic and diastolic function.  She mentions intermittent spontaneous palpitations "like my heart is beating really fast and I have trouble breathing" which do not seem to have any particular pattern, sometimes occur while at rest.   Medication list has been reviewed and updated.  Current Meds  Medication Sig   acetaminophen (TYLENOL) 500 MG tablet Take 500 mg by mouth every 6 (six) hours as needed.   ALPHA LIPOIC ACID PO Take by mouth.   B Complex-Biotin-FA (B-COMPLEX PO) Take by mouth daily.   Bioflavonoid Products (ESTER C PO) Take 1,000 mg by mouth.   BLACK COHOSH PO Take by mouth.   Calcium Carb-Cholecalciferol (CALCIUM 600 + D PO) Take by mouth.   Collagen-Vitamin C-Biotin (COLLAGEN PO) Take by mouth.   cyanocobalamin (VITAMIN B12) 1000 MCG tablet Take by mouth.   esomeprazole (NEXIUM) 20 MG capsule Take 20 mg by mouth daily at 12 noon.   estradiol (ESTRACE) 1 MG tablet Take 1 mg by mouth daily.   Ferrous Sulfate (IRON) 90 (18 Fe) MG TABS Take by mouth daily.   L-Lysine 500 MG CAPS Take by mouth daily.   MAGNESIUM GLYCINATE PLUS PO Take 400 mg by mouth at bedtime.   Misc Natural Products (BEET ROOT PO) Take by mouth.   Multiple Vitamin (MULTI-VITAMINS PO) Take by mouth.   Multiple Vitamins-Minerals (ZINC PO) Take by mouth.   Selenium 200 MCG CAPS Take by mouth daily.   traZODone (DESYREL) 50 MG tablet Take 50 mg by mouth at bedtime.   UNABLE TO  FIND Med Name: Oxbile   vitamin E (VITAMIN E) 400 UNIT capsule Take 400 Units by mouth daily.   [DISCONTINUED] losartan (COZAAR) 50 MG tablet Take 1 tablet (50 mg total) by mouth daily.     Review of Systems  Patient Active Problem List   Diagnosis Date Noted   Numerous dietary supplements 10/22/2023   Systolic murmur at cardiac apex 10/22/2023   Anxiety disorder 05/19/2019   Class 1 obesity due to excess calories with serious comorbidity and body mass index (BMI) of 34.0 to 34.9 in adult 05/19/2019   Essential hypertension 05/19/2019   Gastroesophageal reflux disease 05/19/2019   Menopausal and perimenopausal disorder 05/19/2019   Carpal tunnel syndrome 06/09/2018    Allergies  Allergen Reactions   Lifitegrast     Blurred vision and redness   Lisinopril     Headache, dizziness and blurred vision    Immunization History  Administered Date(s) Administered   Influenza Inj Mdck Quad Pf 07/13/2022   Influenza, Quadrivalent, Recombinant, Inj, Pf 08/19/2019   Influenza-Unspecified 09/08/2019, 07/01/2023   Moderna Sars-Covid-2 Vaccination 10/20/2019   Pneumococcal Polysaccharide-23 08/28/2019   Tdap 05/19/2019   Zoster Recombinant(Shingrix) 05/19/2019, 09/01/2019    Past Surgical History:  Procedure Laterality Date   ABDOMINAL HYSTERECTOMY     CHOLECYSTECTOMY     TUBAL LIGATION  1987    Social History   Tobacco Use  Smoking status: Never   Smokeless tobacco: Never  Vaping Use   Vaping status: Never Used  Substance Use Topics   Alcohol use: Yes    Comment: occasionally   Drug use: Never    Family History  Problem Relation Age of Onset   COPD Mother    Heart disease Mother    Heart attack Mother    Stroke Mother    Emphysema Mother    Alcohol abuse Mother    Anxiety disorder Mother    Hypertension Mother    Varicose Veins Mother    Alcohol abuse Father    Arthritis Father    Heart disease Father    Hypertension Father    Arthritis Paternal Grandmother     Hypertension Paternal Grandmother    Alcohol abuse Sister    Anxiety disorder Sister    Heart disease Sister    Hypertension Sister    Varicose Veins Sister    Alcohol abuse Paternal Aunt    Hypertension Paternal Aunt    Breast cancer Neg Hx         12/03/2023    1:36 PM 10/22/2023   10:49 AM  GAD 7 : Generalized Anxiety Score  Nervous, Anxious, on Edge 2 3  Control/stop worrying 0 0  Worry too much - different things 0 0  Trouble relaxing 2 3  Restless 0 3  Easily annoyed or irritable 0 1  Afraid - awful might happen 0 0  Total GAD 7 Score 4 10  Anxiety Difficulty Not difficult at all Somewhat difficult       12/03/2023    1:36 PM 10/22/2023   10:48 AM 10/22/2023   10:47 AM  Depression screen PHQ 2/9  Decreased Interest 0 3 3  Down, Depressed, Hopeless 0 0 0  PHQ - 2 Score 0 3 3  Altered sleeping 3 0 0  Tired, decreased energy 0 3 3  Change in appetite 0 3 3  Feeling bad or failure about yourself  0 0 0  Trouble concentrating 0 3 3  Moving slowly or fidgety/restless 0 0 0  Suicidal thoughts 0 0 0  PHQ-9 Score 3 12 12   Difficult doing work/chores Not difficult at all Somewhat difficult Not difficult at all    BP Readings from Last 3 Encounters:  12/03/23 132/80  10/22/23 (!) 156/98  09/19/23 (!) 151/87    Wt Readings from Last 3 Encounters:  12/03/23 227 lb (103 kg)  10/22/23 226 lb (102.5 kg)  09/19/23 218 lb (98.9 kg)    BP 132/80 (BP Location: Left Arm, Cuff Size: Large)   Pulse 91   Temp 97.6 F (36.4 C)   Ht 5\' 7"  (1.702 m)   Wt 227 lb (103 kg)   SpO2 97%   BMI 35.55 kg/m   Physical Exam Vitals and nursing note reviewed.  Constitutional:      Appearance: Normal appearance.  Neck:     Vascular: No carotid bruit.  Cardiovascular:     Rate and Rhythm: Normal rate and regular rhythm.     Heart sounds: Murmur heard.     Systolic murmur is present with a grade of 2/6.     No friction rub. No gallop.     Comments: Systolic murmur 2/6 best  appreciated at the apex with radiation to bilateral carotids Pulmonary:     Effort: Pulmonary effort is normal.     Breath sounds: Normal breath sounds.  Abdominal:     General: There is no  distension.  Musculoskeletal:        General: Normal range of motion.  Skin:    General: Skin is warm and dry.  Neurological:     Mental Status: She is alert and oriented to person, place, and time.     Gait: Gait is intact.  Psychiatric:        Mood and Affect: Mood and affect normal.     Recent Labs  No results found for: "NA", "K", "CL", "CO2", "GLUCOSE", "BUN", "CREATININE", "CALCIUM", "PROT", "ALBUMIN", "AST", "ALT", "ALKPHOS", "BILITOT", "GFRNONAA", "GFRAA"  No results found for: "WBC", "HGB", "HCT", "MCV", "PLT" No results found for: "HGBA1C" No results found for: "CHOL", "HDL", "LDLCALC", "LDLDIRECT", "TRIG", "CHOLHDL" No results found for: "TSH"   Assessment and Plan:  1. Essential hypertension (Primary) Both manual blood pressure readings today close to goal, increasing suspicion that home BP readings are overestimating the true blood pressure.  Will increase losartan to 100 mg daily, patient to let me know if any issues. - losartan (COZAAR) 100 MG tablet; Take 1 tablet (100 mg total) by mouth daily.  Dispense: 90 tablet; Refill: 1  2. Palpitations Normal rhythm without skipped/extra beats while auscultating for roughly 45 seconds today.  Will order Zio to capture any intermittent arrhythmias.  Consider also anxiety as a potential cause. - LONG TERM MONITOR (3-14 DAYS)  3. Systolic murmur at cardiac apex Patient reassured of essentially normal recent echo.     Return in about 3 months (around 03/01/2024) for FASTING OV f/u HTN, HLD.    Alvester Morin, PA-C, DMSc, Nutritionist [facility] [phone]

## 2023-12-03 NOTE — Patient Instructions (Signed)

## 2023-12-06 ENCOUNTER — Telehealth: Payer: PRIVATE HEALTH INSURANCE | Admitting: Family Medicine

## 2023-12-06 DIAGNOSIS — H44003 Unspecified purulent endophthalmitis, bilateral: Secondary | ICD-10-CM | POA: Diagnosis not present

## 2023-12-06 MED ORDER — POLYMYXIN B-TRIMETHOPRIM 10000-0.1 UNIT/ML-% OP SOLN: 1.0000 [drp] | Freq: Four times a day (QID) | OPHTHALMIC | 0 refills | Status: DC

## 2023-12-06 NOTE — Patient Instructions (Addendum)
 Rufina Falco, thank you for joining Freddy Finner, NP for today's virtual visit.  While this provider is not your primary care provider (PCP), if your PCP is located in our provider database this encounter information will be shared with them immediately following your visit.   A [facility] MyChart account gives you access to today's visit and all your visits, tests, and labs performed at [facility] " click here if you don't have a [facility] MyChart account or go to [email or web address]  Consent: (Patient) Mary Olsen provided verbal consent for this virtual visit at the beginning of the encounter.  Current Medications:  Current Outpatient Medications:    trimethoprim-polymyxin b (POLYTRIM) ophthalmic solution, Place 1 drop into both eyes in the morning, at noon, in the evening, and at bedtime. Next 5-7 days, Disp: 10 mL, Rfl: 0   acetaminophen (TYLENOL) 500 MG tablet, Take 500 mg by mouth every 6 (six) hours as needed., Disp: , Rfl:    ALPHA LIPOIC ACID PO, Take by mouth., Disp: , Rfl:    B Complex-Biotin-FA (B-COMPLEX PO), Take by mouth daily., Disp: , Rfl:    Bioflavonoid Products (ESTER C PO), Take 1,000 mg by mouth., Disp: , Rfl:    BLACK COHOSH PO, Take by mouth., Disp: , Rfl:    Calcium Carb-Cholecalciferol (CALCIUM 600 + D PO), Take by mouth., Disp: , Rfl:    Collagen-Vitamin C-Biotin (COLLAGEN PO), Take by mouth., Disp: , Rfl:    cyanocobalamin (VITAMIN B12) 1000 MCG tablet, Take by mouth., Disp: , Rfl:    esomeprazole (NEXIUM) 20 MG capsule, Take 20 mg by mouth daily at 12 noon., Disp: , Rfl:    estradiol (ESTRACE) 1 MG tablet, Take 1 mg by mouth daily., Disp: , Rfl: 2   Ferrous Sulfate (IRON) 90 (18 Fe) MG TABS, Take by mouth daily., Disp: , Rfl:    L-Lysine 500 MG CAPS, Take by mouth daily., Disp: , Rfl:    losartan (COZAAR) 100 MG tablet, Take 1 tablet (100 mg total) by mouth daily., Disp: 90 tablet, Rfl: 1   MAGNESIUM GLYCINATE PLUS PO, Take 400 mg  by mouth at bedtime., Disp: , Rfl:    Misc Natural Products (BEET ROOT PO), Take by mouth., Disp: , Rfl:    Multiple Vitamin (MULTI-VITAMINS PO), Take by mouth., Disp: , Rfl:    Multiple Vitamins-Minerals (ZINC PO), Take by mouth., Disp: , Rfl:    Selenium 200 MCG CAPS, Take by mouth daily., Disp: , Rfl:    traZODone (DESYREL) 50 MG tablet, Take 50 mg by mouth at bedtime., Disp: , Rfl:    UNABLE TO FIND, Med Name: Oxbile, Disp: , Rfl:    vitamin E (VITAMIN E) 400 UNIT capsule, Take 400 Units by mouth daily., Disp: , Rfl:    Medications ordered in this encounter:  Meds ordered this encounter  Medications   trimethoprim-polymyxin b (POLYTRIM) ophthalmic solution    Sig: Place 1 drop into both eyes in the morning, at noon, in the evening, and at bedtime. Next 5-7 days    Dispense:  10 mL    Refill:  0    Supervising Provider:   Merrilee Jansky [[id number]]     *If you need refills on other medications prior to your next appointment, please contact your pharmacy*  Follow-Up: Call back or seek an in-person evaluation if the symptoms worsen or if the condition fails to improve as anticipated.  [facility] [phone]  Other Instructions  -keep  hands clean -avoid rubbing eyes -use drops as directed -if symptoms do not improve or if vision changes occur be seen in person    If you have been instructed to have an in-person evaluation today at a local Urgent Care facility, please use the link below. It will take you to a list of all of our available [facility] [place] [facility], including address, phone number and hours of operation. Please do not delay care.  [facility]  If you or a family member do not have a primary care provider, use the link below to schedule a visit and establish care. When you choose a [facility] primary care physician or advanced practice provider, you gain a long-term partner in health. Find a Primary Care Provider  Learn more about  [facility]'s in-office and virtual care options: [facility] - Get Care Now

## 2023-12-06 NOTE — Progress Notes (Signed)
 Virtual Visit Consent   Mary Olsen, you are scheduled for a virtual visit with a [facility] provider today. Just as with appointments in the office, your consent must be obtained to participate. Your consent will be active for this visit and any virtual visit you may have with one of our providers in the next 365 days. If you have a MyChart account, a copy of this consent can be sent to you electronically.  As this is a virtual visit, video technology does not allow for your provider to perform a traditional examination. This may limit your provider's ability to fully assess your condition. If your provider identifies any concerns that need to be evaluated in person or the need to arrange testing (such as labs, EKG, etc.), we will make arrangements to do so. Although advances in technology are sophisticated, we cannot ensure that it will always work on either your end or our end. If the connection with a video visit is poor, the visit may have to be switched to a telephone visit. With either a video or telephone visit, we are not always able to ensure that we have a secure connection.  By engaging in this virtual visit, you consent to the provision of healthcare and authorize for your insurance to be billed (if applicable) for the services provided during this visit. Depending on your insurance coverage, you may receive a charge related to this service.  I need to obtain your verbal consent now. Are you willing to proceed with your visit today? Mary Olsen has provided verbal consent on 12/06/2023 for a virtual visit (video or telephone). Freddy Finner, NP  Date: 12/06/2023 4:00 PM   Virtual Visit via Video Note   I, Freddy Finner, connected with  Mary Olsen  ([id number], [birth year]) on 12/06/23 at  4:00 PM EST by a video-enabled telemedicine application and verified that I am speaking with the correct person using two identifiers.  Location: Patient: Virtual Visit Location Patient:  Home Provider: Virtual Visit Location Provider: Home Office   I discussed the limitations of evaluation and management by telemedicine and the availability of in person appointments. The patient expressed understanding and agreed to proceed.    History of Present Illness: Mary Olsen is a 58 y.o. who identifies as a female who was assigned female at birth, and is being seen today for eye infection  Onset was two days ago- with matting and crusting in the last two days. Unable to open eyes without a warm washcloth to help.  Associated symptoms are unable to wears, gritty sensation.  Modifying factors are warm compresses, cold eye mask, and drops refresh.  Denies chest pain, shortness of breath, fevers, chills, pain with eye movement, swelling of eye lids, or vision changes     Problems:  Patient Active Problem List   Diagnosis Date Noted   Palpitations 12/03/2023   Numerous dietary supplements 10/22/2023   Systolic murmur at cardiac apex 10/22/2023   Anxiety disorder 05/19/2019   Class 1 obesity due to excess calories with serious comorbidity and body mass index (BMI) of 34.0 to 34.9 in adult 05/19/2019   Essential hypertension 05/19/2019   Gastroesophageal reflux disease 05/19/2019   Menopausal and perimenopausal disorder 05/19/2019   Carpal tunnel syndrome 06/09/2018    Allergies:  Allergies  Allergen Reactions   Lifitegrast     Blurred vision and redness   Lisinopril     Headache, dizziness and blurred vision   Medications:  Current Outpatient Medications:  acetaminophen (TYLENOL) 500 MG tablet, Take 500 mg by mouth every 6 (six) hours as needed., Disp: , Rfl:    ALPHA LIPOIC ACID PO, Take by mouth., Disp: , Rfl:    B Complex-Biotin-FA (B-COMPLEX PO), Take by mouth daily., Disp: , Rfl:    Bioflavonoid Products (ESTER C PO), Take 1,000 mg by mouth., Disp: , Rfl:    BLACK COHOSH PO, Take by mouth., Disp: , Rfl:    Calcium Carb-Cholecalciferol (CALCIUM 600 + D PO),  Take by mouth., Disp: , Rfl:    Collagen-Vitamin C-Biotin (COLLAGEN PO), Take by mouth., Disp: , Rfl:    cyanocobalamin (VITAMIN B12) 1000 MCG tablet, Take by mouth., Disp: , Rfl:    esomeprazole (NEXIUM) 20 MG capsule, Take 20 mg by mouth daily at 12 noon., Disp: , Rfl:    estradiol (ESTRACE) 1 MG tablet, Take 1 mg by mouth daily., Disp: , Rfl: 2   Ferrous Sulfate (IRON) 90 (18 Fe) MG TABS, Take by mouth daily., Disp: , Rfl:    L-Lysine 500 MG CAPS, Take by mouth daily., Disp: , Rfl:    losartan (COZAAR) 100 MG tablet, Take 1 tablet (100 mg total) by mouth daily., Disp: 90 tablet, Rfl: 1   MAGNESIUM GLYCINATE PLUS PO, Take 400 mg by mouth at bedtime., Disp: , Rfl:    Misc Natural Products (BEET ROOT PO), Take by mouth., Disp: , Rfl:    Multiple Vitamin (MULTI-VITAMINS PO), Take by mouth., Disp: , Rfl:    Multiple Vitamins-Minerals (ZINC PO), Take by mouth., Disp: , Rfl:    Selenium 200 MCG CAPS, Take by mouth daily., Disp: , Rfl:    traZODone (DESYREL) 50 MG tablet, Take 50 mg by mouth at bedtime., Disp: , Rfl:    UNABLE TO FIND, Med Name: Oxbile, Disp: , Rfl:    vitamin E (VITAMIN E) 400 UNIT capsule, Take 400 Units by mouth daily., Disp: , Rfl:   Observations/Objective: Patient is well-developed, well-nourished in no acute distress.  Resting comfortably  at home.  Head is normocephalic, atraumatic.  No labored breathing.  Speech is clear and coherent with logical content.  Patient is alert and oriented at baseline.  Red eyes, drainage  Assessment and Plan:  1. Eye infection, bilateral (Primary)  - trimethoprim-polymyxin b (POLYTRIM) ophthalmic solution; Place 1 drop into both eyes in the morning, at noon, in the evening, and at bedtime. Next 5-7 days  Dispense: 10 mL; Refill: 0  -keep hands clean -avoid rubbing eyes -use drops as directed -if symptoms do not improve or if vision changes occur be seen in person    Reviewed side effects, risks and benefits of medication.     Patient acknowledged agreement and understanding of the plan.   Past Medical, Surgical, Social History, Allergies, and Medications have been Reviewed.    Follow Up Instructions: I discussed the assessment and treatment plan with the patient. The patient was provided an opportunity to ask questions and all were answered. The patient agreed with the plan and demonstrated an understanding of the instructions.  A copy of instructions were sent to the patient via MyChart unless otherwise noted below.    The patient was advised to call back or seek an in-person evaluation if the symptoms worsen or if the condition fails to improve as anticipated.    Freddy Finner, NP

## 2023-12-07 DIAGNOSIS — R002 Palpitations: Secondary | ICD-10-CM

## 2023-12-20 DIAGNOSIS — M1712 Unilateral primary osteoarthritis, left knee: Secondary | ICD-10-CM | POA: Insufficient documentation

## 2023-12-20 DIAGNOSIS — M25562 Pain in left knee: Secondary | ICD-10-CM | POA: Insufficient documentation

## 2024-01-03 ENCOUNTER — Other Ambulatory Visit: Payer: Self-pay | Admitting: Physician Assistant

## 2024-01-03 DIAGNOSIS — I4729 Other ventricular tachycardia: Secondary | ICD-10-CM | POA: Insufficient documentation

## 2024-01-03 DIAGNOSIS — I471 Supraventricular tachycardia, unspecified: Secondary | ICD-10-CM | POA: Insufficient documentation

## 2024-01-03 MED ORDER — METOPROLOL SUCCINATE ER 25 MG PO TB24: 25.0000 mg | ORAL_TABLET | Freq: Every day | ORAL | 1 refills | Status: DC

## 2024-01-03 NOTE — Telephone Encounter (Signed)
 FYI  KP

## 2024-01-03 NOTE — Addendum Note (Signed)
 Addended by: Remo Lipps on: 01/03/2024 05:04 PM   Modules accepted: Orders

## 2024-01-11 LAB — HM MAMMOGRAPHY

## 2024-01-26 ENCOUNTER — Ambulatory Visit: Payer: PRIVATE HEALTH INSURANCE | Attending: Internal Medicine | Admitting: Internal Medicine

## 2024-01-26 ENCOUNTER — Encounter: Payer: Self-pay | Admitting: Internal Medicine

## 2024-01-26 ENCOUNTER — Other Ambulatory Visit
Admission: RE | Admit: 2024-01-26 | Discharge: 2024-01-26 | Disposition: A | Discharge status: Home / Self Care | Payer: PRIVATE HEALTH INSURANCE | Source: Ambulatory Visit | Attending: Internal Medicine | Admitting: Internal Medicine

## 2024-01-26 VITALS — BP 136/88 | HR 77 | Ht 67.0 in | Wt 229.4 lb

## 2024-01-26 DIAGNOSIS — R002 Palpitations: Secondary | ICD-10-CM | POA: Diagnosis not present

## 2024-01-26 DIAGNOSIS — I471 Supraventricular tachycardia, unspecified: Secondary | ICD-10-CM | POA: Diagnosis not present

## 2024-01-26 DIAGNOSIS — Z79899 Other long term (current) drug therapy: Secondary | ICD-10-CM | POA: Insufficient documentation

## 2024-01-26 DIAGNOSIS — I10 Essential (primary) hypertension: Secondary | ICD-10-CM | POA: Diagnosis not present

## 2024-01-26 DIAGNOSIS — I4729 Other ventricular tachycardia: Secondary | ICD-10-CM

## 2024-01-26 LAB — BASIC METABOLIC PANEL WITH GFR
Anion gap: 8 (ref 5–15)
BUN: 20 mg/dL (ref 6–20)
CO2: 24 mmol/L (ref 22–32)
Calcium: 9 mg/dL (ref 8.9–10.3)
Chloride: 103 mmol/L (ref 98–111)
Creatinine, Ser: 0.66 mg/dL (ref 0.44–1.00)
GFR, Estimated: >60 mL/min (ref >60)
Glucose, Bld: 101 mg/dL — ABNORMAL HIGH (ref 70–99)
Potassium: 4.1 mmol/L (ref 3.5–5.1)
Sodium: 135 mmol/L (ref 135–145)

## 2024-01-26 MED ORDER — METOPROLOL TARTRATE 100 MG PO TABS: 100.0000 mg | ORAL_TABLET | Freq: Once | ORAL | 0 refills | Status: DC

## 2024-01-26 MED ORDER — METOPROLOL SUCCINATE ER 50 MG PO TB24: 50.0000 mg | ORAL_TABLET | Freq: Every day | ORAL | 3 refills | Status: AC

## 2024-01-26 NOTE — Progress Notes (Unsigned)
 Cardiology Office Note:  .   Date:  01/27/2024  ID:  Mary Olsen, DOB 10/16/1965, MRN [id number] PCP: Remo Lipps, PA  [facility] Providers Cardiologist:  None     History of Present Illness: .   Mary Olsen is a 58 y.o. female with history of hypertension, GERD, arthritis, anxiety, and anemia, who has been referred for evaluation of nonsustained ventricular tachycardia.  She mentioned palpitations at her last visit with Mary Olsen in February, prompting placement of an event monitor.  This showed predominantly sinus rhythm with single episode of NSVT lasting 5 beats and 13 supraventricular runs lasting up to 11 beats.  Preceding echo obtained for evaluation of heart murmur showed normal LVEF with mild left atrial enlargement as well as trivial mitral and mild aortic regurgitation.  She was subsequently placed on metoprolol succinate 25 mg daily for treatment of palpitations as well as suboptimally controlled high blood pressure.  Today, Mary Olsen reports that she has had palpitations for years that had been attributed to anxiety.  Despite many anxiety treatments, her palpitations and elevated blood pressures never improved.  Since being placed on metoprolol, she notes that her blood pressure is slightly better though palpitations have not changed much.  They happen randomly throughout the day and seem to take her breath away.  She denies associated chest pain and the lightheadedness.  Palpitations typically last a few seconds but can [place] for up to a minute.  She has a long history of anemia for which she takes iron supplements.  She notes that her legs are sometimes restless and painful as well.  Sometimes her ankles swell, too.   ROS: See HPI  Studies Reviewed: Marland Kitchen   EKG Interpretation Date/Time:  Wednesday January 26 2024 16:17:57 EDT Ventricular Rate:  77 PR Interval:  128 QRS Duration:  88 QT Interval:  376 QTC Calculation: 425 R Axis:   68  Text  Interpretation: Sinus rhythm with Premature atrial complexes Abnormal ECG No previous ECGs available Confirmed by Aubra Pappalardo [phone]) on 01/27/2024 7:27:52 AM    Event monitor (12/03/2023): Predominantly sinus rhythm with single episode of NSVT lasting 5 beats and 13 supraventricular runs lasting up to 11 beats.  Rare PACs and PVCs noted.  TTE (11/23/2023): Normal LV size and wall thickness.  LVEF 55-60% with normal wall motion.  Normal diastolic function and global longitudinal strain (-20.7%).  Normal RV size and function.  Mild left atrial enlargement.  Normal mitral valve with trivial regurgitation.  Tricuspid aortic valve with mild regurgitation.  Normal CVP.  Risk Assessment/Calculations:             Physical Exam:   VS:  BP 136/88   Pulse 77   Ht 5\' 7"  (1.702 m)   Wt 229 lb 6.4 oz (104.1 kg)   SpO2 98%   BMI 35.93 kg/m    Wt Readings from Last 3 Encounters:  01/26/24 229 lb 6.4 oz (104.1 kg)  12/03/23 227 lb (103 kg)  10/22/23 226 lb (102.5 kg)    General:  NAD. Neck: No JVD or HJR. Lungs: Clear to auscultation bilaterally without wheezes or crackles. Heart: Regular rate and rhythm with 2/6 systolic murmur. Abdomen: Soft, nontender, nondistended. Extremities: No lower extremity edema.  ASSESSMENT AND PLAN: .    Palpitations with NSVT and PSVT: Mary Olsen has a long history of palpitations with recent event monitor demonstrating some ventricular and supraventricular ectopy.  Given her associated dyspnea, we have agreed to obtain a coronary  CTA to ensure that she does not have underlying CAD.  We will also escalate metoprolol succinate to 50 mg daily.  Hypertension: Blood pressure borderline elevated today and typically even a little higher at home despite recent addition of metoprolol succinate.  Will plan to increase metoprolol succinate to 50 mg daily for improved blood pressure control and antiarrhythmic therapy.  Continue losartan 100 mg daily as well.  Blood  pressure remains elevated, it may be worthwhile considering a thiazide diuretic or calcium channel blocker like amlodipine in the future.  Evaluation for obstructive sleep apnea in the setting of hypertension and arrhythmias may also need to be considered in the future.    Dispo: Return to clinic in 4 to 6 weeks.  Signed, Sammy Crisp, MD

## 2024-01-26 NOTE — Patient Instructions (Addendum)
 Medication Instructions:   Increase Metoprolol Succinate to 50 MG daily.  The day of Cardiac CT please hold Metoprolol Succinate 50 MG. Take one time dose of Metoprolol Tartrate 100 MG 2 hours prior to Cardiac CT.   *If you need a refill on your cardiac medications before your next appointment, please call your pharmacy*  Lab Work: Your provider would like for you to have following labs drawn today BMP.    If you have labs (blood work) drawn today and your tests are completely normal, you will receive your results only by: MyChart Message (if you have MyChart) OR A paper copy in the mail If you have any lab test that is abnormal or we need to change your treatment, we will call you to review the results.  Testing/Procedures:   Your cardiac CT will be scheduled at one of the below locations:    [facility] [address] [place], [place] [address] [phone]  OR   [facility] [address] [place], [place] [address] [phone]   If scheduled at [facility] or [facility], please arrive 15 mins early for check-in and test prep.  There is spacious parking and easy access to the radiology department from the [facility] entrance. Please enter here and check-in with the desk attendant.    Please follow these instructions carefully (unless otherwise directed):  An IV will be required for this test and Nitroglycerin will be given.  Hold all erectile dysfunction medications at least 3 days (72 hrs) prior to test. (Ie viagra, cialis, sildenafil, tadalafil, etc)   On the Night Before the Test: Be sure to Drink plenty of water. Do not consume any caffeinated/decaffeinated beverages or chocolate 12 hours prior to your test. Do not take any antihistamines 12 hours prior to your test.   On the Day of the Test: Drink plenty of water until 1 hour  prior to the test. Do not eat any food 1 hour prior to test. You may take your regular medications prior to the test except Metoprolol Succinate 50 MG. Take metoprolol tartrate (Lopressor) 100 MG two hours prior to test. If you take Furosemide/Hydrochlorothiazide/Spironolactone/Chlorthalidone, please HOLD on the morning of the test. Patients who wear a continuous glucose monitor MUST remove the device prior to scanning. FEMALES- please wear underwire-free bra if available, avoid dresses & tight clothing  After the Test: Drink plenty of water. After receiving IV contrast, you may experience a mild flushed feeling. This is normal. On occasion, you may experience a mild rash up to 24 hours after the test. This is not dangerous. If this occurs, you can take Benadryl 25 mg, Zyrtec, Claritin, or Allegra and increase your fluid intake. (Patients taking Tikosyn should avoid Benadryl, and may take Zyrtec, Claritin, or Allegra) If you experience trouble breathing, this can be serious. If it is severe call 911 IMMEDIATELY. If it is mild, please call our office.  We will call to schedule your test 2-4 weeks out understanding that some insurance companies will need an authorization prior to the service being performed.   For more information and frequently asked questions, please visit our website : [email or web address]  For non-scheduling related questions, please contact the cardiac imaging nurse navigator should you have any questions/concerns: Cardiac Imaging Nurse Navigators Direct Office Dial: [phone]   For scheduling needs, including cancellations and rescheduling, please call [place], [phone].   Follow-Up: At [facility], you  and your health needs are our priority.  As part of our continuing mission to provide you with exceptional heart care, our providers are all part of one team.  This team includes your primary Cardiologist (physician) and Advanced Practice  Providers or APPs (Physician Assistants and Nurse Practitioners) who all work together to provide you with the care you need, when you need it.  Your next appointment:   4-6 weeks  Provider:   You may see Dr. Nolan Battle or one of the following Advanced Practice Providers on your designated Care Team:   Laneta Pintos, NP Gildardo Labrador, PA-C Varney Gentleman, PA-C Cadence [place], PA-C Ronald Cockayne, NP Morey Ar, NP    We recommend signing up for the patient portal called "MyChart".  Sign up information is provided on this After Visit Summary.  MyChart is used to connect with patients for Virtual Visits (Telemedicine).  Patients are able to view lab/test results, encounter notes, upcoming appointments, etc.  Non-urgent messages can be sent to your provider as well.   To learn more about what you can do with MyChart, go to [email or web address].

## 2024-01-27 ENCOUNTER — Encounter: Payer: Self-pay | Admitting: Internal Medicine

## 2024-01-31 ENCOUNTER — Telehealth: Payer: Self-pay | Admitting: Internal Medicine

## 2024-01-31 NOTE — Telephone Encounter (Signed)
 Spoke with patient. Patient states that insurance will not cover a cardiac CTA. Patient would like to know if there is an alternative test. Will forward to MD.

## 2024-01-31 NOTE — Telephone Encounter (Signed)
 Patient states her insurance will not cover that particular CT, so it has been cancel. Calling to see if there is another test she can do. Please advise

## 2024-02-03 ENCOUNTER — Telehealth: Payer: Self-pay | Admitting: Internal Medicine

## 2024-02-03 DIAGNOSIS — I4729 Other ventricular tachycardia: Secondary | ICD-10-CM

## 2024-02-03 NOTE — Telephone Encounter (Signed)
 Pt said Dr End left her a voice message but her phone only recorder half of the conversation. Please advise

## 2024-02-03 NOTE — Telephone Encounter (Signed)
 I spoke with Ms. Mary Olsen about the denial of her coronary CTA by her insurance.  We have discussed alternative options to evaluate for ischemic heart disease and have agreed to perform an exercise myocardial perfusion stress test (this could be converted to pharmacologic if unable to achieve target heart rate).  She will need to hold her metoprolol  succinate the day before and the day of her stress test.  Informed Consent   Shared Decision Making/Informed Consent The risks [chest pain, shortness of breath, cardiac arrhythmias, dizziness, blood pressure fluctuations, myocardial infarction, stroke/transient ischemic attack, nausea, vomiting, allergic reaction, radiation exposure, metallic taste sensation and life-threatening complications (estimated to be 1 in 10,000)], benefits (risk stratification, diagnosing coronary artery disease, treatment guidance) and alternatives of a nuclear stress test were discussed in detail with Ms. Mary Olsen and she agrees to proceed.    Sammy Crisp, MD [facility]

## 2024-02-03 NOTE — Telephone Encounter (Signed)
 Will proceed with exercise MPI and lieu of CTA (see second telephone note for details).

## 2024-02-04 NOTE — Telephone Encounter (Signed)
 Exercise myoview order placed as requested.  Pt made aware of the following instructions. Message sent to scheduling    Your provider has ordered a Exercise Myoview Stress test. This will take place at [facility]. Please report to the [facility] medical mall entrance. The volunteers at the first desk will direct you where to go.  [facility] MYOVIEW  Your provider has ordered a Stress Test with nuclear imaging. The purpose of this test is to evaluate the blood supply to your heart muscle. This procedure is referred to as a "Non-Invasive Stress Test." This is because other than having an IV started in your vein, nothing is inserted or "invades" your body. Cardiac stress tests are done to find areas of poor blood flow to the heart by determining the extent of coronary artery disease (CAD). Some patients exercise on a treadmill, which naturally increases the blood flow to your heart, while others who are unable to walk on a treadmill due to physical limitations will have a pharmacologic/chemical stress agent called Lexiscan . This medicine will mimic walking on a treadmill by temporarily increasing your coronary blood flow.   Please note: these test may take anywhere between 2-4 hours to complete  How to prepare for your Myoview test:  Nothing to eat for 6 hours prior to the test No caffeine for 24 hours prior to test No smoking 24 hours prior to test. Your medication may be taken with water. Hold BETA BLOCKER 24 hours prior to test (Metoprolol ) Ladies, please do not wear dresses.  Skirts or pants are appropriate. Please wear a short sleeve shirt. No perfume, cologne or lotion. Wear comfortable walking shoes. No heels!   PLEASE NOTIFY THE OFFICE AT LEAST 24 HOURS IN ADVANCE IF YOU ARE UNABLE TO KEEP YOUR APPOINTMENT.  [phone] AND  PLEASE NOTIFY NUCLEAR MEDICINE AT [facility] AT LEAST 24 HOURS IN ADVANCE IF YOU ARE UNABLE TO KEEP YOUR APPOINTMENT. [phone]

## 2024-02-07 ENCOUNTER — Ambulatory Visit: Payer: PRIVATE HEALTH INSURANCE

## 2024-02-10 ENCOUNTER — Telehealth: Payer: PRIVATE HEALTH INSURANCE | Admitting: Physician Assistant

## 2024-02-10 DIAGNOSIS — H1013 Acute atopic conjunctivitis, bilateral: Secondary | ICD-10-CM | POA: Diagnosis not present

## 2024-02-10 DIAGNOSIS — J302 Other seasonal allergic rhinitis: Secondary | ICD-10-CM | POA: Diagnosis not present

## 2024-02-10 MED ORDER — MONTELUKAST SODIUM 10 MG PO TABS: 10.0000 mg | ORAL_TABLET | Freq: Every day | ORAL | 3 refills | Status: DC

## 2024-02-10 MED ORDER — AZELASTINE HCL 0.05 % OP SOLN: 1.0000 [drp] | Freq: Two times a day (BID) | OPHTHALMIC | 1 refills | Status: AC

## 2024-02-10 NOTE — Progress Notes (Signed)
 Virtual Visit Consent   Mary Olsen, you are scheduled for a virtual visit with a [place] provider today. Just as with appointments in the office, your consent must be obtained to participate. Your consent will be active for this visit and any virtual visit you may have with one of our providers in the next 365 days. If you have a MyChart account, a copy of this consent can be sent to you electronically.  As this is a virtual visit, video technology does not allow for your provider to perform a traditional examination. This may limit your provider's ability to fully assess your condition. If your provider identifies any concerns that need to be evaluated in person or the need to arrange testing (such as labs, EKG, etc.), we will make arrangements to do so. Although advances in technology are sophisticated, we cannot ensure that it will always work on either your end or our end. If the connection with a video visit is poor, the visit may have to be switched to a telephone visit. With either a video or telephone visit, we are not always able to ensure that we have a secure connection.  By engaging in this virtual visit, you consent to the provision of healthcare and authorize for your insurance to be billed (if applicable) for the services provided during this visit. Depending on your insurance coverage, you may receive a charge related to this service.  I need to obtain your verbal consent now. Are you willing to proceed with your visit today? [name] has provided verbal consent on 02/10/2024 for a virtual visit (video or telephone). [name], [place]  Date: 02/10/2024 1:04 PM   Virtual Visit via Video Note   I, [name], connected with  [name] Age  ([id number], [birth year]) on 02/10/24 at 12:45 PM EDT by a video-enabled telemedicine application and verified that I am speaking with the correct person using two identifiers.  Location: Patient: Virtual Visit Location  Patient: Home Provider: Virtual Visit Location Provider: Home Office   I discussed the limitations of evaluation and management by telemedicine and the availability of in person appointments. The patient expressed understanding and agreed to proceed.    History of Present Illness: Mary Olsen is a 58 y.o. who identifies as a female who was assigned female at birth, and is being seen today for persistent itching, watering and redness of her eyes associated with increase in her regular seasonal allergy symptoms of nasal congestion and rhinorrhea.  This is despite taking her over-the-counter antihistamines daily, and attempting use of multiple over-the-counter allergy eyedrops.  Denies any vision change.  Denies pain.  Denies fever, chills or sinus pain.   HPI: HPI  Problems:  Patient Active Problem List   Diagnosis Date Noted   Paroxysmal SVT (supraventricular tachycardia) (HCC) 01/03/2024   NSVT (nonsustained ventricular tachycardia) (HCC) 01/03/2024   Palpitations 12/03/2023   Numerous dietary supplements 10/22/2023   Systolic murmur at cardiac apex 10/22/2023   Anxiety disorder 05/19/2019   Class 1 obesity due to excess calories with serious comorbidity and body mass index (BMI) of 34.0 to 34.9 in adult 05/19/2019   Essential hypertension 05/19/2019   Gastroesophageal reflux disease 05/19/2019   Menopausal and perimenopausal disorder 05/19/2019   Carpal tunnel syndrome 06/09/2018    Allergies:  Allergies  Allergen Reactions   Lifitegrast     Blurred vision and redness   Lisinopril     Headache, dizziness and blurred vision   Medications:  Current Outpatient Medications:  azelastine  (OPTIVAR ) 0.05 % ophthalmic solution, Apply 1 drop to eye 2 (two) times daily., Disp: 6 mL, Rfl: 1   montelukast  (SINGULAIR ) 10 MG tablet, Take 1 tablet (10 mg total) by mouth at bedtime., Disp: 30 tablet, Rfl: 3   acetaminophen (TYLENOL) 500 MG tablet, Take 500 mg by mouth every 6 (six) hours as  needed., Disp: , Rfl:    Collagen-Vitamin C-Biotin (COLLAGEN PO), Take by mouth., Disp: , Rfl:    esomeprazole (NEXIUM) 20 MG capsule, Take 20 mg by mouth daily at 12 noon., Disp: , Rfl:    estradiol (ESTRACE) 1 MG tablet, Take 1 mg by mouth daily., Disp: , Rfl: 2   Ferrous Sulfate (IRON) 90 (18 Fe) MG TABS, Take by mouth daily., Disp: , Rfl:    losartan  (COZAAR ) 100 MG tablet, Take 1 tablet (100 mg total) by mouth daily., Disp: 90 tablet, Rfl: 1   MAGNESIUM GLYCINATE PLUS PO, Take 400 mg by mouth at bedtime., Disp: , Rfl:    metoprolol  succinate (TOPROL -XL) 50 MG 24 hr tablet, Take 1 tablet (50 mg total) by mouth daily., Disp: 90 tablet, Rfl: 3   Multiple Vitamin (MULTI-VITAMINS PO), Take by mouth., Disp: , Rfl:    UNABLE TO FIND, Med Name: Oxbile, Disp: , Rfl:    VITAMIN D-VITAMIN K PO, Take 1 tablet by mouth daily., Disp: , Rfl:   Observations/Objective: Patient is well-developed, well-nourished in no acute distress.  Resting comfortably at home.  Head is normocephalic, atraumatic.  No labored breathing.  Speech is clear and coherent with logical content.  Patient is alert and oriented at baseline.  Bilateral conjunctival injection with substantial tearing noted.  Pupils are equal and round.  EOMI.  Assessment and Plan: 1. Allergic conjunctivitis of both eyes (Primary) - montelukast  (SINGULAIR ) 10 MG tablet; Take 1 tablet (10 mg total) by mouth at bedtime.  Dispense: 30 tablet; Refill: 3 - azelastine  (OPTIVAR ) 0.05 % ophthalmic solution; Apply 1 drop to eye 2 (two) times daily.  Dispense: 6 mL; Refill: 1  2. Seasonal allergic rhinitis, unspecified trigger - montelukast  (SINGULAIR ) 10 MG tablet; Take 1 tablet (10 mg total) by mouth at bedtime.  Dispense: 30 tablet; Refill: 3  Supportive measures and OTC medications reviewed.  Continue Allegra over-the-counter.  Will add on trial of Singulair  once daily.  Will prescribe azelastine  eyedrops twice daily.  Follow-up with PCP for ongoing  management.  Follow Up Instructions: I discussed the assessment and treatment plan with the patient. The patient was provided an opportunity to ask questions and all were answered. The patient agreed with the plan and demonstrated an understanding of the instructions.  A copy of instructions were sent to the patient via MyChart unless otherwise noted below.   The patient was advised to call back or seek an in-person evaluation if the symptoms worsen or if the condition fails to improve as anticipated.    [name], PA-C

## 2024-02-10 NOTE — Patient Instructions (Signed)
 Maylani Ulbrich, thank you for joining [name], PA-C for today's virtual visit.  While this provider is not your primary care provider (PCP), if your PCP is located in our provider database this encounter information will be shared with them immediately following your visit.   A [place] MyChart account gives you access to today's visit and all your visits, tests, and labs performed at [facility] " click here if you don't have a [place] MyChart account or go to [email or web address]  Consent: (Patient) Mary Olsen provided verbal consent for this virtual visit at the beginning of the encounter.  Current Medications:  Current Outpatient Medications:    azelastine  (OPTIVAR ) 0.05 % ophthalmic solution, Apply 1 drop to eye 2 (two) times daily., Disp: 6 mL, Rfl: 1   montelukast  (SINGULAIR ) 10 MG tablet, Take 1 tablet (10 mg total) by mouth at bedtime., Disp: 30 tablet, Rfl: 3   acetaminophen (TYLENOL) 500 MG tablet, Take 500 mg by mouth every 6 (six) hours as needed., Disp: , Rfl:    Collagen-Vitamin C-Biotin (COLLAGEN PO), Take by mouth., Disp: , Rfl:    esomeprazole (NEXIUM) 20 MG capsule, Take 20 mg by mouth daily at 12 noon., Disp: , Rfl:    estradiol (ESTRACE) 1 MG tablet, Take 1 mg by mouth daily., Disp: , Rfl: 2   Ferrous Sulfate (IRON) 90 (18 Fe) MG TABS, Take by mouth daily., Disp: , Rfl:    losartan  (COZAAR ) 100 MG tablet, Take 1 tablet (100 mg total) by mouth daily., Disp: 90 tablet, Rfl: 1   MAGNESIUM GLYCINATE PLUS PO, Take 400 mg by mouth at bedtime., Disp: , Rfl:    metoprolol  succinate (TOPROL -XL) 50 MG 24 hr tablet, Take 1 tablet (50 mg total) by mouth daily., Disp: 90 tablet, Rfl: 3   Multiple Vitamin (MULTI-VITAMINS PO), Take by mouth., Disp: , Rfl:    UNABLE TO FIND, Med Name: Oxbile, Disp: , Rfl:    VITAMIN D-VITAMIN K PO, Take 1 tablet by mouth daily., Disp: , Rfl:    Medications ordered in this encounter:  Meds ordered this encounter   Medications   montelukast  (SINGULAIR ) 10 MG tablet    Sig: Take 1 tablet (10 mg total) by mouth at bedtime.    Dispense:  30 tablet    Refill:  3    Supervising Provider:   LAMPTEY, PHILIP O [[id number]]   azelastine  (OPTIVAR ) 0.05 % ophthalmic solution    Sig: Apply 1 drop to eye 2 (two) times daily.    Dispense:  6 mL    Refill:  1    Supervising Provider:   Corine Dice [[id number]]     *If you need refills on other medications prior to your next appointment, please contact your pharmacy*  Follow-Up: Call back or seek an in-person evaluation if the symptoms worsen or if the condition fails to improve as anticipated.  [facility] [phone]  Other Instructions Please continue your daily allergy medications. Start the new prescription eyedrops as directed. Start the Singulair  once daily. Follow-up with your primary care for ongoing management regarding these chronic allergy symptoms.   If you have been instructed to have an in-person evaluation today at a local Urgent Care facility, please use the link below. It will take you to a list of all of our available [place] [facility], including address, phone number and hours of operation. Please do not delay care.  [facility] [facility]  If you or a  family member do not have a primary care provider, use the link below to schedule a visit and establish care. When you choose a [place] primary care physician or advanced practice provider, you gain a long-term partner in health. Find a Primary Care Provider  Learn more about [facility]'s in-office and virtual care options:  - Get Care Now

## 2024-02-21 ENCOUNTER — Ambulatory Visit: Payer: PRIVATE HEALTH INSURANCE

## 2024-03-01 ENCOUNTER — Encounter: Payer: Self-pay | Admitting: Physician Assistant

## 2024-03-01 ENCOUNTER — Ambulatory Visit (INDEPENDENT_AMBULATORY_CARE_PROVIDER_SITE_OTHER): Payer: PRIVATE HEALTH INSURANCE | Admitting: Physician Assistant

## 2024-03-01 VITALS — BP 128/72 | HR 77 | Temp 97.6°F | Ht 67.0 in | Wt 231.0 lb

## 2024-03-01 DIAGNOSIS — E782 Mixed hyperlipidemia: Secondary | ICD-10-CM | POA: Diagnosis not present

## 2024-03-01 DIAGNOSIS — I10 Essential (primary) hypertension: Secondary | ICD-10-CM

## 2024-03-01 DIAGNOSIS — I471 Supraventricular tachycardia, unspecified: Secondary | ICD-10-CM | POA: Diagnosis not present

## 2024-03-01 NOTE — Assessment & Plan Note (Signed)
 Continue antihypertensives as prescribed.  Blood pressure remains at goal.

## 2024-03-01 NOTE — Progress Notes (Signed)
 Date:  03/01/2024   Name:  Mary Olsen   DOB:  Jan 06, 1966   MRN:  [id number]   Chief Complaint: Medical Management of Chronic Issues  HPI Arnetia returns for routine follow up chronic conditions, would like to check fasting labs.  Our last visit Zio patch revealed paroxysmal SVT, so she established with cardiology, currently using losartan  100 mg and metoprolol  50 mg with good compliance and tolerance.  States her symptoms of palpitations have improved.  Cardiology wanted to do a CTA or a nuclear stress test, but because of her insurance, both tests were cost prohibitive.  She is working with cardiology to figure out what could/should be done at a reasonable price.  Home BP 120's/70's.  Medication list has been reviewed and updated.  Current Meds  Medication Sig   acetaminophen (TYLENOL) 500 MG tablet Take 500 mg by mouth every 6 (six) hours as needed.   azelastine  (OPTIVAR ) 0.05 % ophthalmic solution Apply 1 drop to eye 2 (two) times daily.   Collagen-Vitamin C-Biotin (COLLAGEN PO) Take by mouth.   esomeprazole (NEXIUM) 20 MG capsule Take 20 mg by mouth daily at 12 noon.   estradiol (ESTRACE) 2 MG tablet Take 2 mg by mouth daily.   Ferrous Sulfate (IRON) 90 (18 Fe) MG TABS Take by mouth daily.   losartan  (COZAAR ) 100 MG tablet Take 1 tablet (100 mg total) by mouth daily.   MAGNESIUM GLYCINATE PLUS PO Take 400 mg by mouth at bedtime.   metoprolol  succinate (TOPROL -XL) 50 MG 24 hr tablet Take 1 tablet (50 mg total) by mouth daily.   montelukast  (SINGULAIR ) 10 MG tablet Take 1 tablet (10 mg total) by mouth at bedtime.   Multiple Vitamin (MULTI-VITAMINS PO) Take by mouth.   VITAMIN D-VITAMIN K PO Take 1 tablet by mouth daily.   [DISCONTINUED] estradiol (ESTRACE) 1 MG tablet Take 1 mg by mouth daily.   [DISCONTINUED] UNABLE TO FIND Med Name: Oxbile     Review of Systems  Patient Active Problem List   Diagnosis Date Noted   Mixed hyperlipidemia 03/01/2024   Paroxysmal SVT  (supraventricular tachycardia) (HCC) 01/03/2024   NSVT (nonsustained ventricular tachycardia) (HCC) 01/03/2024   Palpitations 12/03/2023   Numerous dietary supplements 10/22/2023   Systolic murmur at cardiac apex 10/22/2023   Anxiety disorder 05/19/2019   Class 1 obesity due to excess calories with serious comorbidity and body mass index (BMI) of 34.0 to 34.9 in adult 05/19/2019   Essential hypertension 05/19/2019   Gastroesophageal reflux disease 05/19/2019   Menopausal and perimenopausal disorder 05/19/2019   Carpal tunnel syndrome 06/09/2018    Allergies  Allergen Reactions   Lifitegrast Other (See Comments)    Blurred vision and redness  lifitegrast   Lisinopril Other (See Comments)    Headache, dizziness and blurred vision  lisinopril    Immunization History  Administered Date(s) Administered   Influenza Inj Mdck Quad Pf 07/13/2022   Influenza, Quadrivalent, Recombinant, Inj, Pf 08/19/2019   Influenza-Unspecified 09/08/2019, 07/01/2023   Moderna Sars-Covid-2 Vaccination 10/20/2019   Pneumococcal Polysaccharide-23 08/28/2019   Tdap 05/19/2019   Zoster Recombinant(Shingrix) 05/19/2019, 09/01/2019    Past Surgical History:  Procedure Laterality Date   ABDOMINAL HYSTERECTOMY     CHOLECYSTECTOMY     TUBAL LIGATION  1987    Social History   Tobacco Use   Smoking status: Never   Smokeless tobacco: Never  Vaping Use   Vaping status: Never Used  Substance Use Topics   Alcohol use: Yes  Comment: occasionally   Drug use: Never    Family History  Problem Relation Age of Onset   Heart failure Mother    COPD Mother    Stroke Mother    Emphysema Mother    Alcohol abuse Mother    Anxiety disorder Mother    Hypertension Mother    Varicose Veins Mother    Anemia Mother    Arrhythmia Mother    Clotting disorder Mother    Heart attack Mother    Heart disease Mother    Alcohol abuse Father    Arthritis Father    Heart disease Father    Hypertension Father     Alcohol abuse Sister    Anxiety disorder Sister    Heart disease Sister    Hypertension Sister    Varicose Veins Sister    Anemia Sister    Alcohol abuse Paternal Aunt    Hypertension Paternal Aunt    Anemia Paternal Aunt    Arthritis Paternal Grandmother    Hypertension Paternal Grandmother    Anemia Paternal Grandmother    Arrhythmia Paternal Grandmother    Clotting disorder Paternal Grandmother    Heart attack Paternal Grandmother    Heart disease Paternal Grandmother    Heart failure Paternal Grandmother    Breast cancer Neg Hx         12/03/2023    1:36 PM 10/22/2023   10:49 AM  GAD 7 : Generalized Anxiety Score  Nervous, Anxious, on Edge 2 3  Control/stop worrying 0 0  Worry too much - different things 0 0  Trouble relaxing 2 3  Restless 0 3  Easily annoyed or irritable 0 1  Afraid - awful might happen 0 0  Total GAD 7 Score 4 10  Anxiety Difficulty Not difficult at all Somewhat difficult       12/03/2023    1:36 PM 10/22/2023   10:48 AM 10/22/2023   10:47 AM  Depression screen PHQ 2/9  Decreased Interest 0 3 3  Down, Depressed, Hopeless 0 0 0  PHQ - 2 Score 0 3 3  Altered sleeping 3 0 0  Tired, decreased energy 0 3 3  Change in appetite 0 3 3  Feeling bad or failure about yourself  0 0 0  Trouble concentrating 0 3 3  Moving slowly or fidgety/restless 0 0 0  Suicidal thoughts 0 0 0  PHQ-9 Score 3 12 12   Difficult doing work/chores Not difficult at all Somewhat difficult Not difficult at all    BP Readings from Last 3 Encounters:  03/01/24 128/72  01/26/24 136/88  12/03/23 132/80    Wt Readings from Last 3 Encounters:  03/01/24 231 lb (104.8 kg)  01/26/24 229 lb 6.4 oz (104.1 kg)  12/03/23 227 lb (103 kg)    BP 128/72 (BP Location: Left Arm, Cuff Size: Large)   Pulse 77   Temp 97.6 F (36.4 C)   Ht 5\' 7"  (1.702 m)   Wt 231 lb (104.8 kg)   SpO2 97%   BMI 36.18 kg/m   Physical Exam Vitals and nursing note reviewed.  Constitutional:       Appearance: Normal appearance.  Cardiovascular:     Rate and Rhythm: Normal rate and regular rhythm.     Heart sounds: Murmur heard.     Systolic murmur is present with a grade of 3/6.     No friction rub. No gallop.  Pulmonary:     Effort: Pulmonary effort is normal.  Breath sounds: Normal breath sounds.  Abdominal:     General: There is no distension.  Musculoskeletal:        General: Normal range of motion.  Skin:    General: Skin is warm and dry.  Neurological:     Mental Status: She is alert and oriented to person, place, and time.     Gait: Gait is intact.  Psychiatric:        Mood and Affect: Mood and affect normal.     Recent Labs     Component Value Date/Time   NA 135 01/26/2024 1719   K 4.1 01/26/2024 1719   CL 103 01/26/2024 1719   CO2 24 01/26/2024 1719   GLUCOSE 101 (H) 01/26/2024 1719   BUN 20 01/26/2024 1719   CREATININE 0.66 01/26/2024 1719   CALCIUM 9.0 01/26/2024 1719   GFRNONAA >60 01/26/2024 1719    No results found for: "WBC", "HGB", "HCT", "MCV", "PLT" No results found for: "HGBA1C" No results found for: "CHOL", "HDL", "LDLCALC", "LDLDIRECT", "TRIG", "CHOLHDL" No results found for: "TSH"   Assessment and Plan:  Essential hypertension Assessment & Plan: Continue antihypertensives as prescribed.  Blood pressure remains at goal.  Orders: -     CBC with Differential/Platelet -     Comprehensive metabolic panel with GFR -     TSH -     Lipid panel  Mixed hyperlipidemia Assessment & Plan: Check fasting lipids today, consider statin if appropriate  Orders: -     Lipid panel  Paroxysmal SVT (supraventricular tachycardia) (HCC) Assessment & Plan: Doing better with metoprolol .  Continue cardiology follow-up  Orders: -     TSH     Return in about 6 months (around 09/01/2024) for OV f/u chronic conditions.    Cody Das, PA-C, DMSc, Nutritionist [facility] [phone]

## 2024-03-01 NOTE — Assessment & Plan Note (Signed)
 Check fasting lipids today, consider statin if appropriate

## 2024-03-01 NOTE — Assessment & Plan Note (Signed)
 Doing better with metoprolol .  Continue cardiology follow-up

## 2024-03-02 ENCOUNTER — Ambulatory Visit: Payer: Self-pay | Admitting: Physician Assistant

## 2024-03-02 LAB — CBC WITH DIFFERENTIAL/PLATELET
Basophils Absolute: 0 10*3/uL (ref 0.0–0.2)
Basos: 1 %
EOS (ABSOLUTE): 0.2 10*3/uL (ref 0.0–0.4)
Eos: 3 %
Hematocrit: 38.7 % (ref 34.0–46.6)
Hemoglobin: 12.8 g/dL (ref 11.1–15.9)
Immature Grans (Abs): 0 10*3/uL (ref 0.0–0.1)
Immature Granulocytes: 0 %
Lymphocytes Absolute: 2.5 10*3/uL (ref 0.7–3.1)
Lymphs: 35 %
MCH: 30 pg (ref 26.6–33.0)
MCHC: 33.1 g/dL (ref 31.5–35.7)
MCV: 91 fL (ref 79–97)
Monocytes Absolute: 0.5 10*3/uL (ref 0.1–0.9)
Monocytes: 7 %
Neutrophils Absolute: 3.9 10*3/uL (ref 1.4–7.0)
Neutrophils: 54 %
Platelets: 304 10*3/uL (ref 150–450)
RBC: 4.27 x10E6/uL (ref 3.77–5.28)
RDW: 12.5 % (ref 11.7–15.4)
WBC: 7.1 10*3/uL (ref 3.4–10.8)

## 2024-03-02 LAB — TSH: TSH: 5.26 u[IU]/mL — ABNORMAL HIGH (ref 0.450–4.500)

## 2024-03-02 LAB — LIPID PANEL
Chol/HDL Ratio: 3.7 ratio (ref 0.0–4.4)
Cholesterol, Total: 223 mg/dL — ABNORMAL HIGH (ref 100–199)
HDL: 61 mg/dL (ref >39)
LDL Chol Calc (NIH): 134 mg/dL — ABNORMAL HIGH (ref 0–99)
Triglycerides: 158 mg/dL — ABNORMAL HIGH (ref 0–149)
VLDL Cholesterol Cal: 28 mg/dL (ref 5–40)

## 2024-03-02 LAB — COMPREHENSIVE METABOLIC PANEL WITH GFR
ALT: 15 IU/L (ref 0–32)
AST: 20 IU/L (ref 0–40)
Albumin: 4.3 g/dL (ref 3.8–4.9)
Alkaline Phosphatase: 92 IU/L (ref 44–121)
BUN/Creatinine Ratio: 18 (ref 9–23)
BUN: 13 mg/dL (ref 6–24)
Bilirubin Total: 0.2 mg/dL (ref 0.0–1.2)
CO2: 22 mmol/L (ref 20–29)
Calcium: 9.7 mg/dL (ref 8.7–10.2)
Chloride: 98 mmol/L (ref 96–106)
Creatinine, Ser: 0.74 mg/dL (ref 0.57–1.00)
Globulin, Total: 2.6 g/dL (ref 1.5–4.5)
Glucose: 116 mg/dL — ABNORMAL HIGH (ref 70–99)
Potassium: 4.4 mmol/L (ref 3.5–5.2)
Sodium: 137 mmol/L (ref 134–144)
Total Protein: 6.9 g/dL (ref 6.0–8.5)
eGFR: 94 mL/min/{1.73_m2} (ref >59)

## 2024-03-02 NOTE — Telephone Encounter (Signed)
 PT response.  JM

## 2024-03-03 LAB — SPECIMEN STATUS REPORT

## 2024-03-03 LAB — HGB A1C W/O EAG: Hgb A1c MFr Bld: 5.6 % (ref 4.8–5.6)

## 2024-03-15 ENCOUNTER — Encounter: Payer: Self-pay | Admitting: Internal Medicine

## 2024-03-15 DIAGNOSIS — R079 Chest pain, unspecified: Secondary | ICD-10-CM

## 2024-03-15 DIAGNOSIS — R002 Palpitations: Secondary | ICD-10-CM

## 2024-03-31 ENCOUNTER — Ambulatory Visit
Admission: RE | Admit: 2024-03-31 | Discharge: 2024-03-31 | Disposition: A | Discharge status: Home / Self Care | Payer: Self-pay | Source: Ambulatory Visit | Attending: Internal Medicine | Admitting: Internal Medicine

## 2024-03-31 DIAGNOSIS — R079 Chest pain, unspecified: Secondary | ICD-10-CM | POA: Insufficient documentation

## 2024-03-31 DIAGNOSIS — R002 Palpitations: Secondary | ICD-10-CM | POA: Insufficient documentation

## 2024-04-04 ENCOUNTER — Ambulatory Visit: Payer: Self-pay | Admitting: Internal Medicine

## 2024-04-06 LAB — HM PAP SMEAR: HM Pap smear: NEGATIVE

## 2024-04-10 ENCOUNTER — Encounter: Payer: Self-pay | Admitting: Internal Medicine

## 2024-04-12 ENCOUNTER — Ambulatory Visit: Payer: PRIVATE HEALTH INSURANCE | Admitting: Internal Medicine

## 2024-04-12 ENCOUNTER — Ambulatory Visit: Payer: PRIVATE HEALTH INSURANCE | Attending: Internal Medicine | Admitting: Internal Medicine

## 2024-04-12 ENCOUNTER — Encounter: Payer: Self-pay | Admitting: Internal Medicine

## 2024-04-12 VITALS — BP 130/77 | HR 68 | Ht 67.0 in | Wt 237.0 lb

## 2024-04-12 DIAGNOSIS — I10 Essential (primary) hypertension: Secondary | ICD-10-CM

## 2024-04-12 DIAGNOSIS — I471 Supraventricular tachycardia, unspecified: Secondary | ICD-10-CM | POA: Diagnosis not present

## 2024-04-12 DIAGNOSIS — I4729 Other ventricular tachycardia: Secondary | ICD-10-CM

## 2024-04-12 DIAGNOSIS — R002 Palpitations: Secondary | ICD-10-CM | POA: Diagnosis not present

## 2024-04-12 DIAGNOSIS — I491 Atrial premature depolarization: Secondary | ICD-10-CM

## 2024-04-12 DIAGNOSIS — R079 Chest pain, unspecified: Secondary | ICD-10-CM

## 2024-04-12 DIAGNOSIS — E782 Mixed hyperlipidemia: Secondary | ICD-10-CM

## 2024-04-12 DIAGNOSIS — D71 Functional disorders of polymorphonuclear neutrophils: Secondary | ICD-10-CM

## 2024-04-12 NOTE — Progress Notes (Unsigned)
 Cardiology Office Note:  .   Date:  04/13/2024  ID:  Mary Olsen, DOB 30-Jul-1966, MRN [id number] PCP: Manya Toribio SQUIBB, PA  [facility] Providers Cardiologist:  Lonni Hanson, MD     History of Present Illness: .   Mary Olsen is a 58 y.o. female with history of PSVT, NSVT, hypertension, GERD, arthritis, anxiety, and anemia, who presents for follow-up of supraventricular and ventricular arrhythmias.  I met her in April after preceding event monitor ordered by her PCP showed a single episode of NSVT and 13 supraventricular runs lasting up to 11 beats.  She was subsequently started on low-dose metoprolol .  She also made note of exertional dyspnea, for which I recommended obtaining a coronary CTA (preceding echo ordered by her PCP did not reveal any significant structural abnormalities).  Unfortunate, Mary Olsen was unable to proceed with CTA due to cost.  Subsequent calcium score was reassuring (CAC equals 0).  Incidental note was made of calcified lung nodule and mediastinal lymph nodes consistent with prior granulomatous disease.  Today, Mary Olsen reports that she continues to have sporadic palpitations throughout the day.  Though frequency seems to be about the same as that our last visit, she notes that the palpitations no longer take her breath away.  She feels like metoprolol  is helping her symptoms.  She denies chest pain and lightheadedness.  She has some exertional dyspnea climbing stairs, which is stable.  She has mild lower extremity edema at times, which dates back decades.  She intermittently checks her blood pressure at home and notes that readings are typically similar to today's measurement in the office.  ROS: See HPI  Studies Reviewed: SABRA   EKG Interpretation Date/Time:  Wednesday April 12 2024 13:40:07 EDT Ventricular Rate:  68 PR Interval:  138 QRS Duration:  84 QT Interval:  390 QTC Calculation: 414 R Axis:   67  Text Interpretation: Sinus rhythm with  Premature atrial complexes Otherwise normal ECG When compared with ECG of 26-Jan-2024 16:17, No significant change was found Confirmed by Sanaii Caporaso [id number]) on 04/13/2024 7:26:20 AM    Coronary calcium score (03/31/2024: Coronary calcium score 0.  Incidental note made of evidence of prior granulomatous disease.  Risk Assessment/Calculations:             Physical Exam:   VS:  BP 130/77 (BP Location: Left Arm, Patient Position: Sitting, Cuff Size: Normal)   Pulse 68   Ht 5' 7 (1.702 m)   Wt 237 lb (107.5 kg)   SpO2 99%   BMI 37.12 kg/m    Wt Readings from Last 3 Encounters:  04/12/24 237 lb (107.5 kg)  03/01/24 231 lb (104.8 kg)  01/26/24 229 lb 6.4 oz (104.1 kg)    General:  NAD. Neck: No JVD or HJR. Lungs: Clear to auscultation bilaterally without wheezes or crackles. Heart: Regular rate and rhythm without murmurs, rubs, or gallops. Abdomen: Soft, nontender, nondistended. Extremities: Trace ankle edema bilaterally.  ASSESSMENT AND PLAN: .    Palpitations with PSVT, NSVT, and PACs: Mary Olsen continues to have daily palpitations though the intensity seems to have improved with escalation of metoprolol .  We discussed increasing this further but have elected to leave metoprolol  succinate at 50 mg daily.  We discussed the role for ischemia evaluation with her ventricular ectopy, though several test that we have ordered thus far have been precluded by lack of insurance coverage.  As she is less symptomatic than at our last visit and has had reassuring  calcium score and echocardiogram, we have agreed to defer ischemia testing for the time being.  If she were to have worsening symptoms, this would need to be readdressed.  Hypertension: Blood pressure borderline elevated today.  We will defer medication changes.  I encouraged Mary Olsen send keep working on diet and exercise.  Hyperlipidemia: Lipid panel in May was notable for mildly elevated LDL and triglycerides at 134 and 158,  respectively.  Given coronary calcium score of 0, we have agreed to defer pharmacotherapy and continue working on lifestyle modifications.  Granulomatous disease: Chest CT commented on evidence of prior granulomatous disease, which would most commonly be seen with a remote fungal infection.  Mary Olsen notes severe COVID-19 infection in the past, though I do not think this would cause the calcifications noted on her chest CT.  I do not think any acute intervention is indicated at this time, though I encouraged Mary Olsen to discuss this further with her PCP at their next visit to determine if pulmonary consultation is indicated.    Dispo: Return to clinic in 6 months.  Signed, Lonni Hanson, MD

## 2024-04-12 NOTE — Patient Instructions (Signed)

## 2024-04-13 ENCOUNTER — Encounter: Payer: Self-pay | Admitting: Internal Medicine

## 2024-04-13 DIAGNOSIS — I491 Atrial premature depolarization: Secondary | ICD-10-CM | POA: Insufficient documentation

## 2024-04-13 DIAGNOSIS — D71 Functional disorders of polymorphonuclear neutrophils: Secondary | ICD-10-CM | POA: Insufficient documentation

## 2024-05-23 ENCOUNTER — Other Ambulatory Visit: Payer: Self-pay | Admitting: Physician Assistant

## 2024-05-23 DIAGNOSIS — I10 Essential (primary) hypertension: Secondary | ICD-10-CM

## 2024-05-25 NOTE — Telephone Encounter (Signed)
 Requested Prescriptions  Pending Prescriptions Disp Refills   losartan (COZAAR) 100 MG tablet [Pharmacy Med Name: Losartan Potassium 100 MG Oral Tablet] 90 tablet 1    Sig: Take 1 tablet by mouth once daily     Cardiovascular:  Angiotensin Receptor Blockers Passed - 05/25/2024  3:37 PM      Passed - Cr in normal range and within 180 days    Creatinine, Ser  Date Value Ref Range Status  03/01/2024 0.74 0.57 - 1.00 mg/dL Final         Passed - K in normal range and within 180 days    Potassium  Date Value Ref Range Status  03/01/2024 4.4 3.5 - 5.2 mmol/L Final         Passed - Patient is not pregnant      Passed - Last BP in normal range    BP Readings from Last 1 Encounters:  04/12/24 130/77         Passed - Valid encounter within last 6 months    Recent Outpatient Visits           2 months ago Essential hypertension   [place] [facility] at [facility], Toribio SQUIBB, [place]   5 months ago Essential hypertension   [facility] at [facility], Toribio SQUIBB, [place]

## 2024-06-05 ENCOUNTER — Other Ambulatory Visit: Payer: Self-pay | Admitting: Internal Medicine

## 2024-08-25 ENCOUNTER — Other Ambulatory Visit: Payer: Self-pay | Admitting: Medical Genetics

## 2024-09-01 ENCOUNTER — Encounter: Payer: Self-pay | Admitting: Physician Assistant

## 2024-09-01 ENCOUNTER — Ambulatory Visit (INDEPENDENT_AMBULATORY_CARE_PROVIDER_SITE_OTHER): Payer: PRIVATE HEALTH INSURANCE | Admitting: Physician Assistant

## 2024-09-01 VITALS — BP 142/88 | HR 80 | Temp 97.9°F | Ht 67.0 in | Wt 239.0 lb

## 2024-09-01 DIAGNOSIS — I10 Essential (primary) hypertension: Secondary | ICD-10-CM

## 2024-09-01 DIAGNOSIS — J069 Acute upper respiratory infection, unspecified: Secondary | ICD-10-CM

## 2024-09-01 DIAGNOSIS — G5603 Carpal tunnel syndrome, bilateral upper limbs: Secondary | ICD-10-CM

## 2024-09-01 DIAGNOSIS — M720 Palmar fascial fibromatosis [Dupuytren]: Secondary | ICD-10-CM | POA: Diagnosis not present

## 2024-09-01 NOTE — Assessment & Plan Note (Addendum)
 Elevated x 2 in clinic today, also elevated at home.  Considered adjusting pharmacotherapy, but encouraged improvements in lifestyle measures, namely physical activity and weight loss.  Short interval follow-up in about 2 months, with BP log for my review at that time

## 2024-09-01 NOTE — Assessment & Plan Note (Signed)
 Continue topical diclofenac, nocturnal wrist splinting. May follow-up with sports medicine if desired.

## 2024-09-01 NOTE — Progress Notes (Addendum)
 Date:  09/01/2024   Name:  Mary Olsen   DOB:  March 30, 1966   MRN:  [id number]   Chief Complaint: Medical Management of Chronic Issues, Sore Throat, and Mass (X3-4 months ago,both hands, more are pooping up, not painful, has done red light therapy in the past for carpel tunnel)  Sore Throat  This is a new problem. Episode onset: X1 week. The problem has been gradually improving. There has been no fever. The pain is at a severity of 5/10. The pain is mild. Associated symptoms include congestion, headaches and swollen glands. She has tried acetaminophen (OTC cough medication, flonase) for the symptoms. The treatment provided mild relief.    Mary Olsen returns to clinic for routine follow-up on chronic conditions, namely HTN, though she also has some acute complaints today.  Blood pressure has been running 130s-140s systolic over 80s-90s diastolic at home.  Visit with cardiology in July, discussed potentially increasing pharmacotherapy at that time, but ultimately decided to focus on lifestyle changes instead.  Patient is not particular physically active, says that when she tried in the past and she was walking very frequently up to 13 miles per week, her weight did not budge.  She really has no strength or resistance training to speak of at this time despite her diagnosis of osteoporosis.  She endorses symptoms of a head cold for the last 7 days, seems to be recovering.  She was undergoing soft wave therapy for bilateral carpal tunnel syndrome a few months ago, after which she noticed nodules of bilateral palms which are painless but bothersome.   Medication list has been reviewed and updated.  Current Meds  Medication Sig   acetaminophen (TYLENOL) 500 MG tablet Take 500 mg by mouth every 6 (six) hours as needed.   azelastine  (OPTIVAR ) 0.05 % ophthalmic solution Apply 1 drop to eye 2 (two) times daily.   Collagen-Vitamin C-Biotin (COLLAGEN PO) Take by mouth.   esomeprazole (NEXIUM) 20 MG  capsule Take 20 mg by mouth daily at 12 noon.   estradiol (ESTRACE) 2 MG tablet Take 2 mg by mouth daily. (Patient taking differently: Take 1 mg by mouth daily.)   Ferrous Sulfate (IRON) 90 (18 Fe) MG TABS Take by mouth daily.   losartan  (COZAAR ) 100 MG tablet Take 1 tablet by mouth once daily   MAGNESIUM GLYCINATE PLUS PO Take 400 mg by mouth at bedtime.   metoprolol  succinate (TOPROL -XL) 50 MG 24 hr tablet Take 1 tablet (50 mg total) by mouth daily.   Multiple Vitamin (MULTI-VITAMINS PO) Take by mouth.   VITAMIN D-VITAMIN K PO Take 1 tablet by mouth daily.     Review of Systems  HENT:  Positive for congestion.   Neurological:  Positive for headaches.    Patient Active Problem List   Diagnosis Date Noted   Dupuytren's disease of palm with nodules without contracture 09/01/2024   PAC (premature atrial contraction) 04/13/2024   Granulomatous disease 04/13/2024   Mixed hyperlipidemia 03/01/2024   PSVT (paroxysmal supraventricular tachycardia) 01/03/2024   NSVT (nonsustained ventricular tachycardia) (HCC) 01/03/2024   Osteoarthritis of left knee 12/20/2023   Arthralgia of left knee 12/20/2023   Palpitations 12/03/2023   Numerous dietary supplements 10/22/2023   Systolic murmur at cardiac apex 10/22/2023   Anxiety disorder 05/19/2019   Class 2 severe obesity with serious comorbidity in adult 05/19/2019   Essential hypertension 05/19/2019   Gastroesophageal reflux disease 05/19/2019   Menopausal and perimenopausal disorder 05/19/2019   Carpal tunnel syndrome 06/09/2018  Allergies  Allergen Reactions   Lifitegrast Other (See Comments)    Blurred vision and redness  lifitegrast   Lisinopril Other (See Comments)    Headache, dizziness and blurred vision  lisinopril    Immunization History  Administered Date(s) Administered   Influenza Inj Mdck Quad Pf 07/13/2022   Influenza, Quadrivalent, Recombinant, Inj, Pf 08/19/2019   Influenza-Unspecified 09/08/2019, 07/01/2023,  07/26/2024   Moderna Sars-Covid-2 Vaccination 10/20/2019   Pneumococcal Polysaccharide-23 08/28/2019   Tdap 05/19/2019   Zoster Recombinant(Shingrix) 05/19/2019, 09/01/2019    Past Surgical History:  Procedure Laterality Date   ABDOMINAL HYSTERECTOMY     CHOLECYSTECTOMY     TUBAL LIGATION  1987    Social History   Tobacco Use   Smoking status: Never   Smokeless tobacco: Never  Vaping Use   Vaping status: Never Used  Substance Use Topics   Alcohol use: Yes    Comment: occasionally   Drug use: Never    Family History  Problem Relation Age of Onset   Heart failure Mother    COPD Mother    Stroke Mother    Emphysema Mother    Alcohol abuse Mother    Anxiety disorder Mother    Hypertension Mother    Varicose Veins Mother    Anemia Mother    Arrhythmia Mother    Clotting disorder Mother    Heart attack Mother    Heart disease Mother    Alcohol abuse Father    Arthritis Father    Heart disease Father    Hypertension Father    Alcohol abuse Sister    Anxiety disorder Sister    Heart disease Sister    Hypertension Sister    Varicose Veins Sister    Anemia Sister    Alcohol abuse Paternal Aunt    Hypertension Paternal Aunt    Anemia Paternal Aunt    Arthritis Paternal Grandmother    Hypertension Paternal Grandmother    Anemia Paternal Grandmother    Arrhythmia Paternal Grandmother    Clotting disorder Paternal Grandmother    Heart attack Paternal Grandmother    Heart disease Paternal Grandmother    Heart failure Paternal Grandmother    Breast cancer Neg Hx         09/01/2024    8:37 AM 12/03/2023    1:36 PM 10/22/2023   10:49 AM  GAD 7 : Generalized Anxiety Score  Nervous, Anxious, on Edge 0 2 3  Control/stop worrying 0 0 0  Worry too much - different things 0 0 0  Trouble relaxing 0 2 3  Restless 0 0 3  Easily annoyed or irritable 0 0 1  Afraid - awful might happen 0 0 0  Total GAD 7 Score 0 4 10  Anxiety Difficulty Not difficult at all Not  difficult at all Somewhat difficult       09/01/2024    8:37 AM 12/03/2023    1:36 PM 10/22/2023   10:48 AM  Depression screen PHQ 2/9  Decreased Interest 0 0 3  Down, Depressed, Hopeless 0 0 0  PHQ - 2 Score 0 0 3  Altered sleeping  3 0  Tired, decreased energy  0 3  Change in appetite  0 3  Feeling bad or failure about yourself   0 0  Trouble concentrating  0 3  Moving slowly or fidgety/restless  0 0  Suicidal thoughts  0 0  PHQ-9 Score  3  12   Difficult doing work/chores  Not difficult at  all Somewhat difficult     Data saved with a previous flowsheet row definition    BP Readings from Last 3 Encounters:  09/01/24 (!) 142/88  04/12/24 130/77  03/01/24 128/72    Wt Readings from Last 3 Encounters:  09/01/24 239 lb (108.4 kg)  04/12/24 237 lb (107.5 kg)  03/01/24 231 lb (104.8 kg)    BP (!) 142/88 (Cuff Size: Large)   Pulse 80   Temp 97.9 F (36.6 C)   Ht 5' 7 (1.702 m)   Wt 239 lb (108.4 kg)   SpO2 97%   BMI 37.43 kg/m   Physical Exam Vitals and nursing note reviewed.  Constitutional:      Appearance: Normal appearance.  HENT:     Mouth/Throat:     Pharynx: Posterior oropharyngeal erythema (mild) present.  Cardiovascular:     Rate and Rhythm: Normal rate.  Pulmonary:     Effort: Pulmonary effort is normal.  Abdominal:     General: There is no distension.  Musculoskeletal:        General: Normal range of motion.     Comments: Nontender immobile firm nodules of bilateral palms R>L without contracture  Skin:    General: Skin is warm and dry.  Neurological:     Mental Status: She is alert and oriented to person, place, and time.     Gait: Gait is intact.  Psychiatric:        Mood and Affect: Mood and affect normal.     Recent Labs     Component Value Date/Time   NA 137 03/01/2024 0844   K 4.4 03/01/2024 0844   CL 98 03/01/2024 0844   CO2 22 03/01/2024 0844   GLUCOSE 116 (H) 03/01/2024 0844   GLUCOSE 101 (H) 01/26/2024 1719   BUN 13  03/01/2024 0844   CREATININE 0.74 03/01/2024 0844   CALCIUM 9.7 03/01/2024 0844   PROT 6.9 03/01/2024 0844   ALBUMIN 4.3 03/01/2024 0844   AST 20 03/01/2024 0844   ALT 15 03/01/2024 0844   ALKPHOS 92 03/01/2024 0844   BILITOT 0.2 03/01/2024 0844   GFRNONAA >60 01/26/2024 1719    Lab Results  Component Value Date   WBC 7.1 03/01/2024   HGB 12.8 03/01/2024   HCT 38.7 03/01/2024   MCV 91 03/01/2024   PLT 304 03/01/2024   Lab Results  Component Value Date   HGBA1C 5.6 03/01/2024   Lab Results  Component Value Date   CHOL 223 (H) 03/01/2024   HDL 61 03/01/2024   LDLCALC 134 (H) 03/01/2024   TRIG 158 (H) 03/01/2024   CHOLHDL 3.7 03/01/2024   Lab Results  Component Value Date   TSH 5.260 (H) 03/01/2024      Assessment and Plan:  Essential hypertension Assessment & Plan: Elevated x 2 in clinic today, also elevated at home.  Considered adjusting pharmacotherapy, but encouraged improvements in lifestyle measures, namely physical activity and weight loss.  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 Selinda Ku.  Would favor this problem to be Dupuytren's nodules. Stretches advised, follow clinically. May follow up with Sports Med if desired.    Bilateral carpal tunnel syndrome Assessment & Plan: Continue topical diclofenac, nocturnal wrist splinting. May follow-up with sports medicine if desired.   Acute URI  Likely viral etiology. Discussed self-limited nature of  viral illnesses and advised conservative measures including rest, fluids, honey, and OTC cough/cold medications. Contact precautions advised to limit spread. Encouraged mask wearing and good hand hygiene especially before meals. Call if acutely worsening symptoms.     Return in about 2 months (around 11/01/2024) for OV f/u HTN.    I personally spent a total of 35 minutes in the care of the patient today including preparing to see the patient, performing a medically appropriate exam/evaluation, counseling and educating, referring and communicating with other health care professionals, documenting clinical information in the EHR, and communicating results.   Rolan Hoyle, PA-C, DMSc, Nutritionist [facility] Primary Care and Sports Medicine [facility] [phone]

## 2024-09-01 NOTE — Assessment & Plan Note (Signed)
 Consulted today with my sports medicine colleague Dr. Selinda Ku.  Would favor this problem to be Dupuytren's nodules. Stretches advised, follow clinically. May follow up with Sports Med if desired.

## 2024-09-29 NOTE — Telephone Encounter (Signed)
 Mary Olsen could speak with her PCP about starting Wegovy to help with weight loss.  Alternatively, we can refer her to the healthy weight and wellness clinic if she is willing to travel to [place].  Lonni Hanson, MD [facility]

## 2024-10-09 NOTE — Telephone Encounter (Signed)
 Please review and advise.  JM

## 2024-10-10 NOTE — Telephone Encounter (Signed)
 Please move pts appt up to a sooner date if possible.  KP

## 2024-10-19 ENCOUNTER — Telehealth: Payer: Self-pay | Admitting: Physician Assistant

## 2024-10-19 ENCOUNTER — Encounter: Payer: Self-pay | Admitting: Physician Assistant

## 2024-10-19 ENCOUNTER — Ambulatory Visit: Payer: PRIVATE HEALTH INSURANCE | Admitting: Physician Assistant

## 2024-10-19 VITALS — BP 138/86 | HR 80 | Temp 98.2°F | Ht 67.0 in | Wt 244.0 lb

## 2024-10-19 DIAGNOSIS — Z6838 Body mass index (BMI) 38.0-38.9, adult: Secondary | ICD-10-CM | POA: Diagnosis not present

## 2024-10-19 DIAGNOSIS — E66812 Obesity, class 2: Secondary | ICD-10-CM | POA: Diagnosis not present

## 2024-10-19 DIAGNOSIS — F411 Generalized anxiety disorder: Secondary | ICD-10-CM | POA: Diagnosis not present

## 2024-10-19 DIAGNOSIS — F329 Major depressive disorder, single episode, unspecified: Secondary | ICD-10-CM | POA: Diagnosis not present

## 2024-10-19 MED ORDER — VENLAFAXINE HCL ER 37.5 MG PO CP24: 37.5000 mg | ORAL_CAPSULE | Freq: Every day | ORAL | 1 refills | Status: DC

## 2024-10-19 NOTE — Assessment & Plan Note (Addendum)
 Treat with venlafaxine 

## 2024-10-19 NOTE — Assessment & Plan Note (Signed)
 Ultimately through shared decision making we will initiate GLP-1 RA medication.  Patient is most interested in oral Wegovy, which was just released this week.  We discussed risks, benefits, mechanism of action, and common side effects from this medication.  No family history of thyroid cancer or MEN2.  No personal history of pancreatitis.  Gallbladder surgically absent.  Just in case we are unable to get the oral medication approved, we will plan on the weekly injectable as a backup; administration instructions reviewed and demonstrated to patient today.  Will initiate prior auth.  Advised the importance of adequate protein intake on this medication as well as resistance training which will not only improve insulin sensitivity but also build muscle mass and reduce risk of atrophy.  I recommended minimum 20 min sessions 3x/wk. Advised importance of portion control and slower eating on this medication to minimize GI side effects. Avoid high fat foods as these may cause discomfort. Patient aware of potential for weight regain when eventually stopping the medication. For this reason, continued efforts toward improving lifestyle will be particularly important moving forward.
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
## 2024-10-19 NOTE — Telephone Encounter (Signed)
 Please complete prior authorization for [facility] TABLETS for morbid obesity with serious comorbidities including HTN, HLD, depression, and arrhythmia.  Please note that the oral form of Wegovy 1.5 mg up to 25 mg was just released on January 5.  As of right now, I do not see it in our epic database but if you could initiate the prior authorization process that would be awesome. Thanks!  Rolan Hoyle, PA-C, DMSc, DipACLM, Nutritionist [facility] Primary Care and Sports Medicine [facility] [phone]

## 2024-10-19 NOTE — Assessment & Plan Note (Signed)
 Given concurrent anxiety and depression, will treat with venlafaxine  which may actually help with her menopausal symptoms as well.  Will start with a low-dose of the extended release, reviewed she may need dose increase to notice any significant effect.  I do think she would benefit from individual as well as couples counseling, but it sounds like neither is easily available to her at this time.  I encouraged her to speak frankly with her partner and voice her concerns in the relationship; if he is not amenable to making any changes to improve the relationship, I believe she should return to her family in [place]  at least for a few weeks and do what is best for her physical and mental health.

## 2024-10-20 ENCOUNTER — Other Ambulatory Visit (HOSPITAL_COMMUNITY): Payer: Self-pay

## 2024-10-23 ENCOUNTER — Other Ambulatory Visit (HOSPITAL_COMMUNITY): Payer: Self-pay

## 2024-10-23 ENCOUNTER — Telehealth: Payer: Self-pay | Admitting: Pharmacy Technician

## 2024-10-23 NOTE — Telephone Encounter (Signed)
 Yes, will do

## 2024-10-23 NOTE — Telephone Encounter (Signed)
 Good morning, I can't send the PA for the tablets just yet, we are waiting for the tablets to be loaded into Epic and [facility].

## 2024-10-23 NOTE — Telephone Encounter (Signed)
 Pharmacy Patient Advocate Encounter   Received notification from Pt Calls Messages that prior authorization for Wegovy 0.25MG /0.5ML auto-injectors is required/requested.   Insurance verification completed.   The patient is insured through [facility].   Per test claim: PA required; PA started via CoverMyMeds. KEY [id number] . Waiting for clinical questions to populate.

## 2024-10-25 NOTE — Telephone Encounter (Signed)
 Pharmacy Patient Advocate Encounter  Received notification from [facility] that Prior Authorization for [facility] 0.25MG /0.5ML auto-injectors has been DENIED.  Full denial letter will be uploaded to the media tab. See denial reason below.     PA #/Case ID/Reference #: [id number]

## 2024-10-30 ENCOUNTER — Other Ambulatory Visit: Payer: Self-pay | Admitting: Physician Assistant

## 2024-10-30 DIAGNOSIS — Z6838 Body mass index (BMI) 38.0-38.9, adult: Secondary | ICD-10-CM

## 2024-10-30 MED ORDER — WEGOVY 1.5 MG PO TABS: 1.5000 mg | ORAL_TABLET | Freq: Every day | ORAL | 0 refills | Status: DC

## 2024-10-30 NOTE — Telephone Encounter (Signed)
 Please review.  KP

## 2024-11-01 ENCOUNTER — Ambulatory Visit: Payer: PRIVATE HEALTH INSURANCE | Admitting: Physician Assistant

## 2024-11-03 ENCOUNTER — Telehealth: Payer: Self-pay

## 2024-11-03 ENCOUNTER — Other Ambulatory Visit (HOSPITAL_COMMUNITY): Payer: Self-pay

## 2024-11-03 NOTE — Telephone Encounter (Signed)
 Please complete PA for Wegovy  1.5 Tablet.  [id number]  KP

## 2024-11-06 ENCOUNTER — Other Ambulatory Visit: Payer: Self-pay | Admitting: Medical Genetics

## 2024-11-06 DIAGNOSIS — Z006 Encounter for examination for normal comparison and control in clinical research program: Secondary | ICD-10-CM

## 2024-11-09 NOTE — Telephone Encounter (Signed)
 Plan does not cover medication pt will try to to get a savings card and pay out of pocket.  KP

## 2024-11-12 ENCOUNTER — Other Ambulatory Visit: Payer: Self-pay | Admitting: Physician Assistant

## 2024-11-12 DIAGNOSIS — I10 Essential (primary) hypertension: Secondary | ICD-10-CM

## 2024-11-14 ENCOUNTER — Other Ambulatory Visit: Payer: Self-pay

## 2024-11-14 NOTE — Telephone Encounter (Signed)
 Requested medication (s) are due for refill today: yes  Requested medication (s) are on the active medication list: yes  Last refill:  05/25/24 #90 1 RF  Future visit scheduled: yes  Notes to clinic:  overdue lab work    Requested Prescriptions  Pending Prescriptions Disp Refills   losartan  (COZAAR ) 100 MG tablet [Pharmacy Med Name: Losartan  Potassium 100 MG Oral Tablet] 90 tablet 0    Sig: Take 1 tablet by mouth once daily     Cardiovascular:  Angiotensin Receptor Blockers Failed - 11/14/2024 10:10 AM      Failed - Cr in normal range and within 180 days    Creatinine, Ser  Date Value Ref Range Status  03/01/2024 0.74 0.57 - 1.00 mg/dL Final         Failed - K in normal range and within 180 days    Potassium  Date Value Ref Range Status  03/01/2024 4.4 3.5 - 5.2 mmol/L Final         Passed - Patient is not pregnant      Passed - Last BP in normal range    BP Readings from Last 1 Encounters:  10/19/24 138/86         Passed - Valid encounter within last 6 months    Recent Outpatient Visits           3 weeks ago Generalized anxiety disorder   [facility] at [facility], Toribio SQUIBB, [place]   2 months ago Essential hypertension   [facility] at [facility], Toribio SQUIBB, [place]   8 months ago Essential hypertension   [facility] at [facility], Toribio SQUIBB, [place]   11 months ago Essential hypertension   [facility] at [facility], Toribio SQUIBB, [place]       Future Appointments             In 2 months End, Lonni, MD [facility] at [facility]

## 2024-11-15 ENCOUNTER — Other Ambulatory Visit (HOSPITAL_COMMUNITY): Payer: Self-pay

## 2024-11-15 ENCOUNTER — Telehealth: Payer: Self-pay

## 2024-11-15 NOTE — Telephone Encounter (Signed)
 Pharmacy Patient Advocate Encounter   Received notification from Pt Calls Messages that prior authorization for Wegovy  1.5mg  tabs is required/requested.   Insurance verification completed.   The patient is insured through [facility].   Per test claim: Per test claim, medication is not covered due to plan/benefit exclusion, PA not submitted at this time  Patient can to go [email or web address] for savings offers

## 2024-11-16 ENCOUNTER — Ambulatory Visit: Admitting: Physician Assistant

## 2024-11-16 ENCOUNTER — Encounter: Payer: Self-pay | Admitting: Physician Assistant

## 2024-11-16 VITALS — BP 132/84 | HR 75 | Temp 97.7°F | Ht 67.0 in | Wt 240.8 lb

## 2024-11-16 DIAGNOSIS — F411 Generalized anxiety disorder: Secondary | ICD-10-CM

## 2024-11-16 DIAGNOSIS — E66812 Obesity, class 2: Secondary | ICD-10-CM

## 2024-11-16 MED ORDER — WEGOVY 4 MG PO TABS: 4.0000 mg | ORAL_TABLET | Freq: Every day | ORAL | 1 refills | Status: AC

## 2024-11-16 MED ORDER — VENLAFAXINE HCL ER 75 MG PO CP24: 75.0000 mg | ORAL_CAPSULE | Freq: Every day | ORAL | 1 refills | Status: AC

## 2024-11-16 NOTE — Assessment & Plan Note (Signed)
 So far patient seems to have done well with oral Wegovy .  Advised to continue the 1.5 mg tablets until gone, but I will go ahead and send the 4 mg tablets as well.  This should last her until the next visit.  Advised the importance of adequate protein intake on this medication as well as resistance training which will not only improve insulin sensitivity but also build muscle mass and reduce risk of atrophy.  I recommended minimum 20 min sessions 3x/wk for strength training, with overall exercise goal of 150 min moderate intensity per week. Advised importance of portion control and slower eating on this medication to minimize GI side effects. Avoid high fat foods as these may cause discomfort. Patient aware of potential for weight regain when eventually stopping the medication. For this reason, continued efforts toward improving lifestyle will be particularly important moving forward. Patient will have routine and ongoing support for nutrition guidance, lifestyle modification, and medication adjustments.   Orders:   WEGOVY  4 MG tablet; Take 1 tablet (4 mg total) by mouth daily. Daily in morning on an empty stomach with 4 oz of water. Do not eat or drink for 30 minutes after dose.

## 2024-11-16 NOTE — Progress Notes (Signed)
 "   Date:  11/16/2024   Name:  Mary Olsen   DOB:  [birth year]   MRN:  [id number]   Chief Complaint: Follow-up  HPI  Mary Olsen returns for 1 month follow-up on anxiety and weight management.  Last visit I started her on venlafaxine  37.5 mg which she has been taking daily with good compliance.  Initially she had some headaches, but these subsided.  She has not yet noticed any benefit to her anxiety/depressive symptoms.  Just completed 1 month of this medication.  She started Wegovy  tablets 1.5 mg about 2 weeks ago, presently self-pay for this.  Compared to last visit, she is down 4 pounds and is excited about this.  She has also been walking around the neighborhood and using light weights for strength training.  Medication list has been reviewed and updated.  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 09/01/2024   PAC (premature atrial contraction) 04/13/2024   Granulomatous disease 04/13/2024   Mixed hyperlipidemia 03/01/2024   PSVT (paroxysmal supraventricular tachycardia) 01/03/2024   NSVT (nonsustained ventricular tachycardia) (HCC) 01/03/2024   Osteoarthritis of left knee 12/20/2023   Arthralgia of left knee 12/20/2023   Palpitations 12/03/2023   Numerous dietary supplements 10/22/2023   Systolic murmur at cardiac apex 10/22/2023   Anxiety disorder 05/19/2019   Class 2 severe obesity with serious comorbidity in adult 05/19/2019   Essential hypertension 05/19/2019   Gastroesophageal reflux disease 05/19/2019   Menopausal and perimenopausal disorder 05/19/2019   Carpal tunnel syndrome 06/09/2018    Allergies[2]  Immunization History  Administered Date(s) Administered   Influenza Inj Mdck Quad Pf 07/13/2022   Influenza, Quadrivalent, Recombinant, Inj, Pf 08/19/2019   Influenza-Unspecified 09/08/2019, 07/01/2023,  07/26/2024   Moderna Sars-Covid-2 Vaccination 10/20/2019   Pneumococcal Polysaccharide-23 08/28/2019   Tdap 05/19/2019   Zoster Recombinant(Shingrix) 05/19/2019, 09/01/2019    Past Surgical History:  Procedure Laterality Date   ABDOMINAL HYSTERECTOMY     CHOLECYSTECTOMY     TUBAL LIGATION  1987    Social History[3]  Family History  Problem Relation Age of Onset   Heart failure Mother    COPD Mother    Stroke Mother    Emphysema Mother    Alcohol abuse Mother    Anxiety disorder Mother    Hypertension Mother    Varicose Veins Mother    Anemia Mother    Arrhythmia Mother    Clotting disorder Mother    Heart attack Mother    Heart disease Mother    Alcohol abuse Father    Arthritis Father    Heart disease Father    Hypertension Father    Alcohol abuse Sister    Anxiety disorder Sister    Heart disease Sister    Hypertension Sister    Varicose Veins Sister    Anemia Sister    Alcohol abuse Paternal Aunt    Hypertension Paternal Aunt    Anemia Paternal Aunt    Arthritis Paternal Grandmother    Hypertension Paternal Grandmother    Anemia Paternal Grandmother    Arrhythmia Paternal Grandmother    Clotting disorder Paternal Grandmother    Heart attack Paternal Grandmother    Heart disease Paternal Grandmother    Heart failure Paternal Grandmother    Breast cancer Neg Hx         11/16/2024    2:51 PM 10/19/2024  4:20 PM 09/01/2024    8:37 AM 12/03/2023    1:36 PM  GAD 7 : Generalized Anxiety Score  Nervous, Anxious, on Edge 1 2  0  2   Control/stop worrying 0 3  0  0   Worry too much - different things 3 3  0  0   Trouble relaxing 3 3  0  2   Restless 2 2  0  0   Easily annoyed or irritable 2 2  0  0   Afraid - awful might happen 1 1  0  0   Total GAD 7 Score 12 16 0 4  Anxiety Difficulty Somewhat difficult Somewhat difficult Not difficult at all Not difficult at all     Data saved with a previous flowsheet row definition       11/16/2024    2:50 PM  10/19/2024    4:19 PM 09/01/2024    8:37 AM  Depression screen PHQ 2/9  Decreased Interest 3 3 0  Down, Depressed, Hopeless 3 3 0  PHQ - 2 Score 6 6 0  Altered sleeping 2 2   Tired, decreased energy 3 3   Change in appetite 0 0   Feeling bad or failure about yourself  1 1   Trouble concentrating 3 3   Moving slowly or fidgety/restless 2 2   Suicidal thoughts 0 0   PHQ-9 Score 17 17   Difficult doing work/chores  Somewhat difficult     BP Readings from Last 3 Encounters:  11/16/24 132/84  10/19/24 138/86  09/01/24 (!) 142/88    Wt Readings from Last 3 Encounters:  11/16/24 240 lb 12.8 oz (109.2 kg)  10/19/24 244 lb (110.7 kg)  09/01/24 239 lb (108.4 kg)    BP 132/84   Pulse 75   Temp 97.7 F (36.5 C) (Oral)   Ht 5' 7 (1.702 m)   Wt 240 lb 12.8 oz (109.2 kg)   SpO2 97%   BMI 37.71 kg/m   Physical Exam Vitals and nursing note reviewed.  Constitutional:      Appearance: Normal appearance.  Cardiovascular:     Rate and Rhythm: Normal rate.  Pulmonary:     Effort: Pulmonary effort is normal.  Abdominal:     General: There is no distension.  Musculoskeletal:        General: Normal range of motion.  Skin:    General: Skin is warm and dry.  Neurological:     Mental Status: She is alert and oriented to person, place, and time.     Gait: Gait is intact.  Psychiatric:        Mood and Affect: Mood and affect normal.     Recent Labs     Component Value Date/Time   NA 137 03/01/2024 0844   K 4.4 03/01/2024 0844   CL 98 03/01/2024 0844   CO2 22 03/01/2024 0844   GLUCOSE 116 (H) 03/01/2024 0844   GLUCOSE 101 (H) 01/26/2024 1719   BUN 13 03/01/2024 0844   CREATININE 0.74 03/01/2024 0844   CALCIUM 9.7 03/01/2024 0844   PROT 6.9 03/01/2024 0844   ALBUMIN 4.3 03/01/2024 0844   AST 20 03/01/2024 0844   ALT 15 03/01/2024 0844   ALKPHOS 92 03/01/2024 0844   BILITOT 0.2 03/01/2024 0844   GFRNONAA >60 01/26/2024 1719    Lab Results  Component Value Date   WBC  7.1 03/01/2024   HGB 12.8 03/01/2024   HCT 38.7 03/01/2024  MCV 91 03/01/2024   PLT 304 03/01/2024   Lab Results  Component Value Date   HGBA1C 5.6 03/01/2024   Lab Results  Component Value Date   CHOL 223 (H) 03/01/2024   HDL 61 03/01/2024   LDLCALC 134 (H) 03/01/2024   TRIG 158 (H) 03/01/2024   CHOLHDL 3.7 03/01/2024   Lab Results  Component Value Date   TSH 5.260 (H) 03/01/2024        Assessment & Plan Generalized anxiety disorder Increase venlafaxine  to 75 mg daily Orders:   venlafaxine  XR (EFFEXOR  XR) 75 MG 24 hr capsule; Take 1 capsule (75 mg total) by mouth daily with breakfast.  Class 2 severe obesity with serious comorbidity and body mass index (BMI) of 38.0 to 38.9 in adult, unspecified obesity type So far patient seems to have done well with oral Wegovy .  Advised to continue the 1.5 mg tablets until gone, but I will go ahead and send the 4 mg tablets as well.  This should last her until the next visit.  Advised the importance of adequate protein intake on this medication as well as resistance training which will not only improve insulin sensitivity but also build muscle mass and reduce risk of atrophy.  I recommended minimum 20 min sessions 3x/wk for strength training, with overall exercise goal of 150 min moderate intensity per week. Advised importance of portion control and slower eating on this medication to minimize GI side effects. Avoid high fat foods as these may cause discomfort. Patient aware of potential for weight regain when eventually stopping the medication. For this reason, continued efforts toward improving lifestyle will be particularly important moving forward. Patient will have routine and ongoing support for nutrition guidance, lifestyle modification, and medication adjustments.   Orders:   WEGOVY  4 MG tablet; Take 1 tablet (4 mg total) by mouth daily. Daily in morning on an empty stomach with 4 oz of water. Do not eat or drink for 30 minutes  after dose.     Follow-up 5-6 weeks OV anx/dep/Weg   Rolan Hoyle, PA-C, DMSc, DipACLM, Nutritionist [place] [facility] [phone]      [1]  Current Meds  Medication Sig   acetaminophen (TYLENOL) 500 MG tablet Take 500 mg by mouth every 6 (six) hours as needed.   azelastine  (OPTIVAR ) 0.05 % ophthalmic solution Apply 1 drop to eye 2 (two) times daily.   Collagen-Vitamin C-Biotin (COLLAGEN PO) Take by mouth.   esomeprazole (NEXIUM) 20 MG capsule Take 20 mg by mouth daily at 12 noon.   estradiol (ESTRACE) 1 MG tablet Take by mouth.   Ferrous Sulfate (IRON) 90 (18 Fe) MG TABS Take by mouth daily.   losartan  (COZAAR ) 100 MG tablet Take 1 tablet by mouth once daily   MAGNESIUM GLYCINATE PLUS PO Take 400 mg by mouth at bedtime.   metoprolol  succinate (TOPROL -XL) 50 MG 24 hr tablet Take 1 tablet (50 mg total) by mouth daily.   Multiple Vitamin (MULTI-VITAMINS PO) Take by mouth.   VITAMIN D-VITAMIN K PO Take 1 tablet by mouth daily.   WEGOVY  4 MG tablet Take 1 tablet (4 mg total) by mouth daily. Daily in morning on an empty stomach with 4 oz of water. Do not eat or drink for 30 minutes after dose.   [DISCONTINUED] venlafaxine  XR (EFFEXOR  XR) 37.5 MG 24 hr capsule Take 1 capsule (37.5 mg total) by mouth daily with breakfast.   [DISCONTINUED] WEGOVY  1.5  MG tablet Take 1 tablet (1.5 mg total) by mouth daily. Daily in AM on an empty stomach with 4 oz of water. Do not eat or drink for 30 minutes after dose.  [2]  Allergies Allergen Reactions   Lifitegrast Other (See Comments)    Blurred vision and redness  lifitegrast   Lisinopril Other (See Comments)    Headache, dizziness and blurred vision  lisinopril  [3]  Social History Tobacco Use   Smoking status: Never   Smokeless tobacco: Never  Vaping Use   Vaping status: Never Used  Substance Use Topics   Alcohol use: Yes    Comment: occasionally   Drug use:  Never   "

## 2024-11-16 NOTE — Assessment & Plan Note (Signed)
 Increase venlafaxine  to 75 mg daily Orders:   venlafaxine  XR (EFFEXOR  XR) 75 MG 24 hr capsule; Take 1 capsule (75 mg total) by mouth daily with breakfast.

## 2024-12-26 ENCOUNTER — Ambulatory Visit: Admitting: Physician Assistant

## 2025-01-17 ENCOUNTER — Ambulatory Visit: Admitting: Internal Medicine
